# Patient Record
Sex: Female | Born: 1990 | Race: White | Hispanic: No | Marital: Married | Smoking: Never smoker
Health system: Southern US, Community
[De-identification: ages and names within clinical notes are randomized; demographics above are authoritative.]

## PROBLEM LIST (undated history)

## (undated) DIAGNOSIS — J45909 Unspecified asthma, uncomplicated: Secondary | ICD-10-CM

## (undated) HISTORY — DX: Unspecified asthma, uncomplicated: J45.909

## (undated) HISTORY — PX: WISDOM TOOTH EXTRACTION: SHX21

---

## 2015-03-15 ENCOUNTER — Emergency Department: Payer: BLUE CROSS/BLUE SHIELD

## 2015-03-15 ENCOUNTER — Emergency Department
Admission: EM | Admit: 2015-03-15 | Discharge: 2015-03-15 | Disposition: A | Discharge status: Home / Self Care | Payer: BLUE CROSS/BLUE SHIELD | Attending: Emergency Medicine | Admitting: Emergency Medicine

## 2015-03-15 ENCOUNTER — Encounter: Payer: Self-pay | Admitting: Emergency Medicine

## 2015-03-15 DIAGNOSIS — Z3A01 Less than 8 weeks gestation of pregnancy: Secondary | ICD-10-CM | POA: Diagnosis not present

## 2015-03-15 DIAGNOSIS — R110 Nausea: Secondary | ICD-10-CM | POA: Diagnosis not present

## 2015-03-15 DIAGNOSIS — R197 Diarrhea, unspecified: Secondary | ICD-10-CM | POA: Diagnosis not present

## 2015-03-15 DIAGNOSIS — R1032 Left lower quadrant pain: Secondary | ICD-10-CM | POA: Insufficient documentation

## 2015-03-15 DIAGNOSIS — Z3491 Encounter for supervision of normal pregnancy, unspecified, first trimester: Secondary | ICD-10-CM

## 2015-03-15 DIAGNOSIS — O9989 Other specified diseases and conditions complicating pregnancy, childbirth and the puerperium: Secondary | ICD-10-CM | POA: Insufficient documentation

## 2015-03-15 LAB — COMPREHENSIVE METABOLIC PANEL
ALT: 19 U/L (ref 14–54)
AST: 16 U/L (ref 15–41)
Albumin: 4.3 g/dL (ref 3.5–5.0)
Alkaline Phosphatase: 58 U/L (ref 38–126)
Anion gap: 7 (ref 5–15)
BUN: 12 mg/dL (ref 6–20)
CHLORIDE: 104 mmol/L (ref 101–111)
CO2: 24 mmol/L (ref 22–32)
CREATININE: 0.6 mg/dL (ref 0.44–1.00)
Calcium: 9.5 mg/dL (ref 8.9–10.3)
GFR calc non Af Amer: >60 mL/min (ref >60)
Glucose, Bld: 85 mg/dL (ref 65–99)
POTASSIUM: 3.8 mmol/L (ref 3.5–5.1)
SODIUM: 135 mmol/L (ref 135–145)
Total Bilirubin: 0.5 mg/dL (ref 0.3–1.2)
Total Protein: 8.3 g/dL — ABNORMAL HIGH (ref 6.5–8.1)

## 2015-03-15 LAB — URINALYSIS COMPLETE WITH MICROSCOPIC (ARMC ONLY)
BILIRUBIN URINE: NEGATIVE
Glucose, UA: NEGATIVE mg/dL
Ketones, ur: NEGATIVE mg/dL
LEUKOCYTES UA: NEGATIVE
Nitrite: NEGATIVE
PH: 6 (ref 5.0–8.0)
PROTEIN: NEGATIVE mg/dL
Specific Gravity, Urine: 1.02 (ref 1.005–1.030)

## 2015-03-15 LAB — CBC
HEMATOCRIT: 38.6 % (ref 35.0–47.0)
Hemoglobin: 12.9 g/dL (ref 12.0–16.0)
MCH: 29.2 pg (ref 26.0–34.0)
MCHC: 33.5 g/dL (ref 32.0–36.0)
MCV: 87 fL (ref 80.0–100.0)
PLATELETS: 233 10*3/uL (ref 150–440)
RBC: 4.44 MIL/uL (ref 3.80–5.20)
RDW: 13.8 % (ref 11.5–14.5)
WBC: 10.9 10*3/uL (ref 3.6–11.0)

## 2015-03-15 LAB — HCG, QUANTITATIVE, PREGNANCY: HCG, BETA CHAIN, QUANT, S: 45247 m[IU]/mL — AB (ref <5)

## 2015-03-15 LAB — LIPASE, BLOOD: LIPASE: 43 U/L (ref 11–51)

## 2015-03-15 LAB — POCT PREGNANCY, URINE: PREG TEST UR: POSITIVE — AB

## 2015-03-15 NOTE — ED Notes (Signed)
MD Lord at bedside. 

## 2015-03-15 NOTE — ED Notes (Addendum)
Pt presents to ED with c/o lower left quadrant abdominal cramping, diarrhea and nausea x3 weeks. Pt is [redacted] weeks pregnant and has not seen OBGYN yet, denies discharge of vaginal bleed. Pt alerts and oriented x4 at this time and signs of distress noted. 10 bowl movements within last 24hrs.

## 2015-03-15 NOTE — ED Notes (Signed)
Pt returned from U/S via stretcher. 

## 2015-03-15 NOTE — ED Provider Notes (Signed)
[facility] Emergency Department Provider Note   ____________________________________________  Time seen:  I have reviewed the triage vital signs and the triage nursing note.  HISTORY  Chief Complaint Abdominal Pain; Diarrhea; and Nausea   Historian Patient  HPI Chelsey Fernandez is a 24 y.o. female is here for evaluation of several weeks of nausea and diarrhea with some intermittent abdominal cramping which is typically in the left lower quadrant. Patient did find out recently that she had a positive pregnancy test. She has not had an ultrasound yet. She's not having any black or bloody stools. No vomiting. Seems to be diarrhea no matter what she eats. No specific exacerbating or alleviating factors. She did try giving up they referred one or 2 weeks and did not see any difference. No out of country travel. No sick contacts. No bad food.Symptoms are moderate.    History reviewed. No pertinent past medical history.  There are no active problems to display for this patient.   History reviewed. No pertinent past surgical history.  No current outpatient prescriptions on file.  Allergies Review of patient's allergies indicates no known allergies.  History reviewed. No pertinent family history. no family history of Crohn's disease or ulcerative colitis/irritable bowel disease.  Social History Social History  Substance Use Topics  . Smoking status: Never Smoker   . Smokeless tobacco: None  . Alcohol Use: No    Review of Systems  Constitutional: Negative for fever. Eyes: Negative for visual changes. ENT: Negative for sore throat. Cardiovascular: Negative for chest pain. Respiratory: Negative for shortness of breath. Gastrointestinal: Negative for previous problems with constipation and diarrhea. Genitourinary: Negative for dysuria. Musculoskeletal: Negative for back pain. Skin: Negative for rash. Neurological: Negative for headache. 10 point Review  of Systems otherwise negative ____________________________________________   PHYSICAL EXAM:  VITAL SIGNS: ED Triage Vitals  Enc Vitals Group     BP 03/15/15 1931 118/60 mmHg     Pulse Rate 03/15/15 1931 82     Resp 03/15/15 1931 18     Temp 03/15/15 1931 97.4 F (36.3 C)     Temp Source 03/15/15 1931 Oral     SpO2 03/15/15 1931 100 %     Weight 03/15/15 1931 204 lb (92.534 kg)     Height 03/15/15 1931  (1.702 m)     Head Cir --      Peak Flow --      Pain Score 03/15/15 1931 4     Pain Loc --      Pain Edu? --      Excl. in GC? --      Constitutional: Alert and oriented. Well appearing and in no distress. Eyes: Conjunctivae are normal. PERRL. Normal extraocular movements. ENT   Head: Normocephalic and atraumatic.   Nose: No congestion/rhinnorhea.   Mouth/Throat: Mucous membranes are moist.   Neck: No stridor. Cardiovascular/Chest: Normal rate, regular rhythm.  No murmurs, rubs, or gallops. Respiratory: Normal respiratory effort without tachypnea nor retractions. Breath sounds are clear and equal bilaterally. No wheezes/rales/rhonchi. Gastrointestinal: Soft. No distention, no guarding, no rebound. Nontender  Genitourinary/rectal:Deferred Musculoskeletal: Nontender with normal range of motion in all extremities. No joint effusions.  No lower extremity tenderness.  No edema. Neurologic:  Normal speech and language. No gross or focal neurologic deficits are appreciated. Skin:  Skin is warm, dry and intact. No rash noted. Psychiatric: Mood and affect are normal. Speech and behavior are normal. Patient exhibits appropriate insight and judgment.  ____________________________________________   EKG  I, Governor Rooksebecca Taelyr Jantz, MD, the attending physician have personally viewed and interpreted all ECGs.  None ____________________________________________  LABS (pertinent positives/negatives)  Lipase 43 Comprehensive metabolic panel without significant abnormality CBC  within normal limits Urinalysis rare bacteria and otherwise negative Pregnancy test positive Beta-hCG pending ____________________________________________  RADIOLOGY All Xrays were viewed by me. Imaging interpreted by Radiologist.  Ultrasound pelvic and transvaginal:  IMPRESSION: Single live intrauterine pregnancy noted, with a crown-rump length of 5 mm, corresponding to a gestational age of [redacted] weeks 1 day. This matches the gestational age by LMP, and reflects an estimated date of delivery of November 07, 2015. __________________________________________  PROCEDURES  Procedure(s) performed: None  Critical Care performed: None  ____________________________________________   ED COURSE / ASSESSMENT AND PLAN  CONSULTATIONS: None  Pertinent labs & imaging results that were available during my care of the patient were reviewed by me and considered in my medical decision making (see chart for details).   In terms of the diarrhea 3 weeks, her exam and evaluation are reassuring. I discussed with her to follow up with primary care physician and likely gastroenterologist. She may need to send a stool culture, and she can range his with primary care physician. We did discuss the possibility of trying an elimination diet.  Although she's not had any vaginal bleeding, and she doesn't currently have abdominal pain, she has had some intermittent recurrent left-sided left lower quadrant pain, and so I felt it necessary to image the early pregnancy to confirm intrauterine pregnancy. Single live IUP estimated be 6 weeks 1 day it was confirmed.   Patient / Family / Caregiver informed of clinical course, medical decision-making process, and agree with plan.   I discussed return precautions, follow-up instructions, and discharged instructions with patient and/or family.  ___________________________________________   FINAL CLINICAL IMPRESSION(S) / ED DIAGNOSES   Final diagnoses:  First  trimester pregnancy  Diarrhea, unspecified type       Governor Rooksebecca Geraldyn Shain, MD 03/15/15 2237

## 2015-03-15 NOTE — Discharge Instructions (Signed)
You were evaluated for ongoing diarrhea, and although no certain cause was found, your exam and evaluation are reassuring. Return to the emergency room for any worsening condition including abdominal pain, black or bloody diarrhea, or fever. We discussed you may try an elimination diet such as the "Whole 30"  Take only Tylenol and pregnancy.   Diarrhea Diarrhea is frequent loose and watery bowel movements. It can cause you to feel weak and dehydrated. Dehydration can cause you to become tired and thirsty, have a dry mouth, and have decreased urination that often is dark yellow. Diarrhea is a sign of another problem, most often an infection that will not last long. In most cases, diarrhea typically lasts 2-3 days. However, it can last longer if it is a sign of something more serious. It is important to treat your diarrhea as directed by your caregiver to lessen or prevent future episodes of diarrhea. CAUSES  Some common causes include:  Gastrointestinal infections caused by viruses, bacteria, or parasites.  Food poisoning or food allergies.  Certain medicines, such as antibiotics, chemotherapy, and laxatives.  Artificial sweeteners and fructose.  Digestive disorders. HOME CARE INSTRUCTIONS  Ensure adequate fluid intake (hydration): Have 1 cup (8 oz) of fluid for each diarrhea episode. Avoid fluids that contain simple sugars or sports drinks, fruit juices, whole milk products, and sodas. Your urine should be clear or pale yellow if you are drinking enough fluids. Hydrate with an oral rehydration solution that you can purchase at pharmacies, retail stores, and online. You can prepare an oral rehydration solution at home by mixing the following ingredients together:   - tsp table salt.   tsp baking soda.   tsp salt substitute containing potassium chloride.  1  tablespoons sugar.  1 L (34 oz) of water.  Certain foods and beverages may increase the speed at which food moves through the  gastrointestinal (GI) tract. These foods and beverages should be avoided and include:  Caffeinated and alcoholic beverages.  High-fiber foods, such as raw fruits and vegetables, nuts, seeds, and whole grain breads and cereals.  Foods and beverages sweetened with sugar alcohols, such as xylitol, sorbitol, and mannitol.  Some foods may be well tolerated and may help thicken stool including:  Starchy foods, such as rice, toast, pasta, low-sugar cereal, oatmeal, grits, baked potatoes, crackers, and bagels.  Bananas.  Applesauce.  Add probiotic-rich foods to help increase healthy bacteria in the GI tract, such as yogurt and fermented milk products.  Wash your hands well after each diarrhea episode.  Only take over-the-counter or prescription medicines as directed by your caregiver.  Take a warm bath to relieve any burning or pain from frequent diarrhea episodes. SEEK IMMEDIATE MEDICAL CARE IF:   You are unable to keep fluids down.  You have persistent vomiting.  You have blood in your stool, or your stools are black and tarry.  You do not urinate in 6-8 hours, or there is only a small amount of very dark urine.  You have abdominal pain that increases or localizes.  You have weakness, dizziness, confusion, or light-headedness.  You have a severe headache.  Your diarrhea gets worse or does not get better.  You have a fever or persistent symptoms for more than 2-3 days.  You have a fever and your symptoms suddenly get worse. MAKE SURE YOU:   Understand these instructions.  Will watch your condition.  Will get help right away if you are not doing well or get worse.   This  information is not intended to replace advice given to you by your health care provider. Make sure you discuss any questions you have with your health care provider.   Document Released: 03/16/2002 Document Revised: 04/16/2014 Document Reviewed: 12/02/2011 Elsevier Interactive Patient Education [id number]  [employer]vier Inc[employer]

## 2015-03-18 ENCOUNTER — Ambulatory Visit (INDEPENDENT_AMBULATORY_CARE_PROVIDER_SITE_OTHER): Payer: BLUE CROSS/BLUE SHIELD | Admitting: Physician Assistant

## 2015-03-18 ENCOUNTER — Encounter: Payer: Self-pay | Admitting: Physician Assistant

## 2015-03-18 VITALS — BP 112/60 | HR 79 | Temp 98.5°F | Resp 16 | Ht 67.0 in | Wt 203.8 lb

## 2015-03-18 DIAGNOSIS — Z3401 Encounter for supervision of normal first pregnancy, first trimester: Secondary | ICD-10-CM

## 2015-03-18 DIAGNOSIS — R197 Diarrhea, unspecified: Secondary | ICD-10-CM | POA: Diagnosis not present

## 2015-03-18 NOTE — Progress Notes (Signed)
Patient: Chelsey Fernandez Female    DOB: 1991/03/17   24 y.o.   MRN: [id number] Visit Date: 03/18/2015  Today's Provider: Margaretann Loveless, PA-C   Chief Complaint  Patient presents with  . Establish Care  . Diarrhea   Subjective:    HPI Establish Care: Patient is here as new patient to establish care. Patient is currently [redacted] weeks pregnant. Per patient had a wellness April of this year. Per patient pap came back normal. Patient previous doctors was [facility]. Patient is also concern about having diarrhea for 4 weeks. Went to [facility] walk in clinic and was told patient had colitis caused by pregnancy hormones. Since the diarrhea continued after that visit with diet changes, including adding a probiotic, patient went to the ED [facility] on Tuesday the 7th and was told it was most likely an abnormal growth of bacteria caused by pregnancy. She was told to continue the probiotic and add fiber. Per patient she has added probiotics and preprobiotic (Pedialyte with prebiotic) and eating a fiber-rich cereal and it seems to help. Per patient thinks is getting better. Had 4 bowel movement yesterday and they were normal. He has not had a bowel movement today. She states her concern now is that yesterday and today following eating she noticed being more bloated than normal but that it subsides within 30 minutes to an hour after eating. She would like to follow through with stool cultures as recommended by the ER physician to make sure there is no other cause for her symptoms. She denies any fevers, chills, nausea, vomiting, melena or hematochezia. There is no family history of inflammatory bowel disease including Crohn's or ulcerative colitis. There is no family history of colon cancer.     No Known Allergies Previous Medications   PRENATAL VIT-FE FUMARATE-FA (PRENATAL VITAMIN PO)    Take by mouth daily.    Review of Systems    Constitutional: Negative.   HENT: Negative.   Eyes: Negative.   Respiratory: Negative.   Cardiovascular: Negative.   Gastrointestinal: Positive for abdominal pain, diarrhea and abdominal distention. Negative for nausea, vomiting and blood in stool.  Endocrine: Negative.   Genitourinary: Negative.   Musculoskeletal: Negative.   Allergic/Immunologic: Negative.   Neurological: Negative.   Hematological: Negative.   Psychiatric/Behavioral: Negative.     Social History  Substance Use Topics  . Smoking status: Never Smoker   . Smokeless tobacco: Never Used  . Alcohol Use: No   Objective:   BP 112/60 mmHg  Pulse 79  Temp(Src) 98.5 F (36.9 C) (Oral)  Resp 16  Ht  (1.702 m)  Wt 203 lb 12.8 oz (92.443 kg)  BMI 31.91 kg/m2  LMP 01/31/2015 (Approximate)  Physical Exam  Constitutional: She is oriented to person, place, and time. She appears well-developed and well-nourished. No distress.  HENT:  Head: Normocephalic and atraumatic.  Right Ear: External ear normal.  Left Ear: External ear normal.  Nose: Nose normal.  Mouth/Throat: Oropharynx is clear and moist. No oropharyngeal exudate.  Eyes: Conjunctivae and EOM are normal. Pupils are equal, round, and reactive to light. Right eye exhibits no discharge. Left eye exhibits no discharge. No scleral icterus.  Neck: Normal range of motion. Neck supple. No JVD present. No tracheal deviation present. No thyromegaly present.  Cardiovascular: Normal rate, regular rhythm, normal heart sounds and intact distal pulses.  Exam reveals no gallop and no friction rub.   No murmur heard.  Pulmonary/Chest: Effort normal and breath sounds normal. No respiratory distress. She has no wheezes. She has no rales. She exhibits no tenderness.  Abdominal: Soft. Bowel sounds are normal. She exhibits no distension and no mass. There is no hepatosplenomegaly. There is generalized tenderness. There is no rigidity, no rebound, no guarding, no CVA tenderness,  no tenderness at McBurney's point and negative Murphy's sign.  Musculoskeletal: Normal range of motion. She exhibits no edema or tenderness.  Lymphadenopathy:    She has no cervical adenopathy.  Neurological: She is alert and oriented to person, place, and time.  Skin: Skin is warm and dry. No rash noted. She is not diaphoretic.  Psychiatric: She has a normal mood and affect. Her behavior is normal. Judgment and thought content normal.  Vitals reviewed.       Assessment & Plan:     1. Diarrhea, unspecified type Unknown cause. It is felt that since she is having improvement with prebiotics, probiotics and increased fiber that she may have had a bacterial overgrowth in the gut secondary to stress on the body from the pregnancy and increasing hormones. We will go ahead and obtain a stool sample for labs as below. She has had blood work done on December 7 which was all normal. I will follow-up with her pending the stool culture labs. If everything is normal and she continues to improve with diet changes I will follow-up with her in 4 weeks to make sure that she is continuing to do well. She may cancel this appointment if she continues to improve and feels it is not necessary. If anything comes back abnormal or she begins to develop diarrhea again we will you with referral to gastroenterology for further evaluation. In the meantime she is to continue her current diet changes. I did advise her to please call the office if symptoms fail to improve or worsen in the meantime. - Stool Culture - Stool, WBC/Lactoferrin - Stool C-Diff Toxin Assay - Ova and parasite examination  2. Pregnancy, first, first trimester Is [facility]. She has her first appointment in approximately 4 weeks there as well when she is [redacted] weeks pregnant. She is currently 6 weeks and 3 days.       Margaretann LovelessJennifer M Mee Macdonnell, PA-C  [facility]

## 2015-03-18 NOTE — Patient Instructions (Signed)
Diarrhea Diarrhea is frequent loose and watery bowel movements. It can cause you to feel weak and dehydrated. Dehydration can cause you to become tired and thirsty, have a dry mouth, and have decreased urination that often is dark yellow. Diarrhea is a sign of another problem, most often an infection that will not last long. In most cases, diarrhea typically lasts 2-3 days. However, it can last longer if it is a sign of something more serious. It is important to treat your diarrhea as directed by your caregiver to lessen or prevent future episodes of diarrhea. CAUSES  Some common causes include:  Gastrointestinal infections caused by viruses, bacteria, or parasites.  Food poisoning or food allergies.  Certain medicines, such as antibiotics, chemotherapy, and laxatives.  Artificial sweeteners and fructose.  Digestive disorders. HOME CARE INSTRUCTIONS  Ensure adequate fluid intake (hydration): Have 1 cup (8 oz) of fluid for each diarrhea episode. Avoid fluids that contain simple sugars or sports drinks, fruit juices, whole milk products, and sodas. Your urine should be clear or pale yellow if you are drinking enough fluids. Hydrate with an oral rehydration solution that you can purchase at pharmacies, retail stores, and online. You can prepare an oral rehydration solution at home by mixing the following ingredients together:   - tsp table salt.   tsp baking soda.   tsp salt substitute containing potassium chloride.  1  tablespoons sugar.  1 L (34 oz) of water.  Certain foods and beverages may increase the speed at which food moves through the gastrointestinal (GI) tract. These foods and beverages should be avoided and include:  Caffeinated and alcoholic beverages.  High-fiber foods, such as raw fruits and vegetables, nuts, seeds, and whole grain breads and cereals.  Foods and beverages sweetened with sugar alcohols, such as xylitol, sorbitol, and mannitol.  Some foods may be well  tolerated and may help thicken stool including:  Starchy foods, such as rice, toast, pasta, low-sugar cereal, oatmeal, grits, baked potatoes, crackers, and bagels.  Bananas.  Applesauce.  Add probiotic-rich foods to help increase healthy bacteria in the GI tract, such as yogurt and fermented milk products.  Wash your hands well after each diarrhea episode.  Only take over-the-counter or prescription medicines as directed by your caregiver.  Take a warm bath to relieve any burning or pain from frequent diarrhea episodes. SEEK IMMEDIATE MEDICAL CARE IF:   You are unable to keep fluids down.  You have persistent vomiting.  You have blood in your stool, or your stools are black and tarry.  You do not urinate in 6-8 hours, or there is only a small amount of very dark urine.  You have abdominal pain that increases or localizes.  You have weakness, dizziness, confusion, or light-headedness.  You have a severe headache.  Your diarrhea gets worse or does not get better.  You have a fever or persistent symptoms for more than 2-3 days.  You have a fever and your symptoms suddenly get worse. MAKE SURE YOU:   Understand these instructions.  Will watch your condition.  Will get help right away if you are not doing well or get worse.   This information is not intended to replace advice given to you by your health care provider. Make sure you discuss any questions you have with your health care provider.   Document Released: 03/16/2002 Document Revised: 04/16/2014 Document Reviewed: 12/02/2011 Elsevier Interactive Patient Education 2016 Elsevier Inc[employer]   Food Choices to Help Relieve Diarrhea, Adult When you have   diarrhea, the foods you eat and your eating habits are very important. Choosing the right foods and drinks can help relieve diarrhea. Also, because diarrhea can last up to 7 days, you need to replace lost fluids and electrolytes (such as sodium, potassium, and chloride) in  order to help prevent dehydration.  WHAT GENERAL GUIDELINES DO I NEED TO FOLLOW?  Slowly drink 1 cup (8 oz) of fluid for each episode of diarrhea. If you are getting enough fluid, your urine will be clear or pale yellow.  Eat starchy foods. Some good choices include white rice, white toast, pasta, low-fiber cereal, baked potatoes (without the skin), saltine crackers, and bagels.  Avoid large servings of any cooked vegetables.  Limit fruit to two servings per day. A serving is  cup or 1 small piece.  Choose foods with less than 2 g of fiber per serving.  Limit fats to less than 8 tsp (38 g) per day.  Avoid fried foods.  Eat foods that have probiotics in them. Probiotics can be found in certain dairy products.  Avoid foods and beverages that may increase the speed at which food moves through the stomach and intestines (gastrointestinal tract). Things to avoid include:  High-fiber foods, such as dried fruit, raw fruits and vegetables, nuts, seeds, and whole grain foods.  Spicy foods and high-fat foods.  Foods and beverages sweetened with high-fructose corn syrup, honey, or sugar alcohols such as xylitol, sorbitol, and mannitol. WHAT FOODS ARE RECOMMENDED? Grains White rice. White, French, or pita breads (fresh or toasted), including plain rolls, buns, or bagels. White pasta. Saltine, soda, or graham crackers. Pretzels. Low-fiber cereal. Cooked cereals made with water (such as cornmeal, farina, or cream cereals). Plain muffins. Matzo. Melba toast. Zwieback.  Vegetables Potatoes (without the skin). Strained tomato and vegetable juices. Most well-cooked and canned vegetables without seeds. Tender lettuce. Fruits Cooked or canned applesauce, apricots, cherries, fruit cocktail, grapefruit, peaches, pears, or plums. Fresh bananas, apples without skin, cherries, grapes, cantaloupe, grapefruit, peaches, oranges, or plums.  Meat and Other Protein Products Baked or boiled chicken. Eggs. Tofu.  Fish. Seafood. Smooth peanut butter. Ground or well-cooked tender beef, ham, veal, lamb, pork, or poultry.  Dairy Plain yogurt, kefir, and unsweetened liquid yogurt. Lactose-free milk, buttermilk, or soy milk. Plain hard cheese. Beverages Sport drinks. Clear broths. Diluted fruit juices (except prune). Regular, caffeine-free sodas such as ginger ale. Water. Decaffeinated teas. Oral rehydration solutions. Sugar-free beverages not sweetened with sugar alcohols. Other Bouillon, broth, or soups made from recommended foods.  The items listed above may not be a complete list of recommended foods or beverages. Contact your dietitian for more options. WHAT FOODS ARE NOT RECOMMENDED? Grains Whole grain, whole wheat, bran, or rye breads, rolls, pastas, crackers, and cereals. Wild or brown rice. Cereals that contain more than 2 g of fiber per serving. Corn tortillas or taco shells. Cooked or dry oatmeal. Granola. Popcorn. Vegetables Raw vegetables. Cabbage, broccoli, Brussels sprouts, artichokes, baked beans, beet greens, corn, kale, legumes, peas, sweet potatoes, and yams. Potato skins. Cooked spinach and cabbage. Fruits Dried fruit, including raisins and dates. Raw fruits. Stewed or dried prunes. Fresh apples with skin, apricots, mangoes, pears, raspberries, and strawberries.  Meat and Other Protein Products Chunky peanut butter. Nuts and seeds. Beans and lentils. Bacon.  Dairy High-fat cheeses. Milk, chocolate milk, and beverages made with milk, such as milk shakes. Cream. Ice cream. Sweets and Desserts Sweet rolls, doughnuts, and sweet breads. Pancakes and waffles. Fats and Oils Butter. Cream sauces.   Margarine. Salad oils. Plain salad dressings. Olives. Avocados.  Beverages Caffeinated beverages (such as coffee, tea, soda, or energy drinks). Alcoholic beverages. Fruit juices with pulp. Prune juice. Soft drinks sweetened with high-fructose corn syrup or sugar alcohols. Other Coconut. Hot sauce.  Chili powder. Mayonnaise. Gravy. Cream-based or milk-based soups.  The items listed above may not be a complete list of foods and beverages to avoid. Contact your dietitian for more information. WHAT SHOULD I DO IF I BECOME DEHYDRATED? Diarrhea can sometimes lead to dehydration. Signs of dehydration include dark urine and dry mouth and skin. If you think you are dehydrated, you should rehydrate with an oral rehydration solution. These solutions can be purchased at pharmacies, retail stores, or online.  Drink -1 cup (120-240 mL) of oral rehydration solution each time you have an episode of diarrhea. If drinking this amount makes your diarrhea worse, try drinking smaller amounts more often. For example, drink 1-3 tsp (5-15 mL) every 5-10 minutes.  A general rule for staying hydrated is to drink 1-2 L of fluid per day. Talk to your health care provider about the specific amount you should be drinking each day. Drink enough fluids to keep your urine clear or pale yellow.   This information is not intended to replace advice given to you by your health care provider. Make sure you discuss any questions you have with your health care provider.   Document Released: 06/16/2003 Document Revised: 04/16/2014 Document Reviewed: 02/16/2013 Elsevier Interactive Patient Education 2016 Elsevier Inc.   

## 2015-04-10 NOTE — L&D Delivery Note (Addendum)
Delivery Note At 1:16 AM on 11/11/15 a viable female was delivered via Vaginal, Spontaneous Delivery (Presentation: ROA;  ).delayed cord clamping   APGAR: 8, 9; weight  .   Placenta status:intact delivered 10 minutes post SVD , .  Cord:  with the following complications: .   Anesthesia:   Episiotomy:  none Lacerations: 2nd degree Suture Repair: 2.0 3.0 vicryl Est. Blood Loss (mL):  200cc  Mom to postpartum.  Baby to Couplet care / Skin to Skin.  Chelsey Fernandez 11/11/2015, 1:41 AM

## 2015-04-15 ENCOUNTER — Ambulatory Visit: Payer: BLUE CROSS/BLUE SHIELD | Admitting: Physician Assistant

## 2015-04-21 LAB — OB RESULTS CONSOLE HEPATITIS B SURFACE ANTIGEN: HEP B S AG: NEGATIVE

## 2015-04-21 LAB — OB RESULTS CONSOLE RPR: RPR: NONREACTIVE

## 2015-04-21 LAB — OB RESULTS CONSOLE HIV ANTIBODY (ROUTINE TESTING): HIV: NONREACTIVE

## 2015-04-21 LAB — OB RESULTS CONSOLE RUBELLA ANTIBODY, IGM: RUBELLA: IMMUNE

## 2015-04-21 LAB — OB RESULTS CONSOLE VARICELLA ZOSTER ANTIBODY, IGG: Varicella: IMMUNE

## 2015-04-21 LAB — OB RESULTS CONSOLE GC/CHLAMYDIA
CHLAMYDIA, DNA PROBE: NEGATIVE
Gonorrhea: NEGATIVE

## 2015-11-10 ENCOUNTER — Inpatient Hospital Stay
Admission: EM | Admit: 2015-11-10 | Discharge: 2015-11-13 | DRG: 775 | Disposition: A | Discharge status: Home / Self Care | Payer: BLUE CROSS/BLUE SHIELD | Attending: Obstetrics and Gynecology | Admitting: Obstetrics and Gynecology

## 2015-11-10 DIAGNOSIS — O99824 Streptococcus B carrier state complicating childbirth: Secondary | ICD-10-CM | POA: Diagnosis present

## 2015-11-10 DIAGNOSIS — Z3A40 40 weeks gestation of pregnancy: Secondary | ICD-10-CM | POA: Diagnosis not present

## 2015-11-10 DIAGNOSIS — R079 Chest pain, unspecified: Secondary | ICD-10-CM | POA: Diagnosis not present

## 2015-11-10 LAB — CBC
HCT: 34.9 % — ABNORMAL LOW (ref 35.0–47.0)
HEMOGLOBIN: 12.5 g/dL (ref 12.0–16.0)
MCH: 30.7 pg (ref 26.0–34.0)
MCHC: 35.9 g/dL (ref 32.0–36.0)
MCV: 85.5 fL (ref 80.0–100.0)
PLATELETS: 206 10*3/uL (ref 150–440)
RBC: 4.09 MIL/uL (ref 3.80–5.20)
RDW: 14.7 % — AB (ref 11.5–14.5)
WBC: 11 10*3/uL (ref 3.6–11.0)

## 2015-11-10 LAB — CHLAMYDIA/NGC RT PCR (ARMC ONLY)
CHLAMYDIA TR: NOT DETECTED
N gonorrhoeae: NOT DETECTED

## 2015-11-10 LAB — TYPE AND SCREEN
ABO/RH(D): O POS
ANTIBODY SCREEN: NEGATIVE

## 2015-11-10 MED ORDER — LACTATED RINGERS IV SOLN: 500.0000 mL | INTRAVENOUS | Status: DC | PRN

## 2015-11-10 MED ORDER — SODIUM CHLORIDE FLUSH 0.9 % IV SOLN
INTRAVENOUS | Status: AC
  Filled 2015-11-10: qty 10

## 2015-11-10 MED ORDER — OXYTOCIN 40 UNITS IN LACTATED RINGERS INFUSION - SIMPLE MED
1.0000 m[IU]/min | INTRAVENOUS | Status: DC
  Administered 2015-11-10: 2 m[IU]/min via INTRAVENOUS

## 2015-11-10 MED ORDER — OXYTOCIN BOLUS FROM INFUSION
500.0000 mL | Freq: Once | INTRAVENOUS | Status: AC
  Administered 2015-11-11: 500 mL via INTRAVENOUS

## 2015-11-10 MED ORDER — SOD CITRATE-CITRIC ACID 500-334 MG/5ML PO SOLN: 30.0000 mL | ORAL | Status: DC | PRN

## 2015-11-10 MED ORDER — ACETAMINOPHEN 325 MG PO TABS: 650.0000 mg | ORAL_TABLET | ORAL | Status: DC | PRN

## 2015-11-10 MED ORDER — AMMONIA AROMATIC IN INHA
RESPIRATORY_TRACT | Status: AC
  Filled 2015-11-10: qty 10

## 2015-11-10 MED ORDER — FLEET ENEMA 7-19 GM/118ML RE ENEM: 1.0000 | ENEMA | RECTAL | Status: DC | PRN

## 2015-11-10 MED ORDER — BUTORPHANOL TARTRATE 1 MG/ML IJ SOLN
1.0000 mg | INTRAMUSCULAR | Status: DC | PRN
  Administered 2015-11-10 (×2): 1 mg via INTRAVENOUS
  Filled 2015-11-10 (×2): qty 1

## 2015-11-10 MED ORDER — ONDANSETRON HCL 4 MG/2ML IJ SOLN: 4.0000 mg | Freq: Four times a day (QID) | INTRAMUSCULAR | Status: DC | PRN

## 2015-11-10 MED ORDER — LACTATED RINGERS IV SOLN
INTRAVENOUS | Status: DC
  Administered 2015-11-10: 14:00:00 via INTRAVENOUS

## 2015-11-10 MED ORDER — OXYTOCIN 40 UNITS IN LACTATED RINGERS INFUSION - SIMPLE MED
INTRAVENOUS | Status: AC
  Administered 2015-11-10: 2 m[IU]/min via INTRAVENOUS
  Filled 2015-11-10: qty 1000

## 2015-11-10 MED ORDER — MISOPROSTOL 200 MCG PO TABS
ORAL_TABLET | ORAL | Status: AC
  Filled 2015-11-10: qty 4

## 2015-11-10 MED ORDER — SODIUM CHLORIDE 0.9 % IV SOLN
1.0000 g | INTRAVENOUS | Status: DC
  Administered 2015-11-10 (×2): 1 g via INTRAVENOUS
  Filled 2015-11-10 (×4): qty 1000

## 2015-11-10 MED ORDER — LIDOCAINE HCL (PF) 1 % IJ SOLN
INTRAMUSCULAR | Status: AC
  Administered 2015-11-11: 30 mL via SUBCUTANEOUS
  Filled 2015-11-10: qty 30

## 2015-11-10 MED ORDER — OXYTOCIN 40 UNITS IN LACTATED RINGERS INFUSION - SIMPLE MED
2.5000 [IU]/h | INTRAVENOUS | Status: DC
  Administered 2015-11-11: 2.5 [IU]/h via INTRAVENOUS

## 2015-11-10 MED ORDER — SODIUM CHLORIDE 0.9 % IV SOLN
2.0000 g | Freq: Once | INTRAVENOUS | Status: AC
  Administered 2015-11-10: 2 g via INTRAVENOUS
  Filled 2015-11-10: qty 2000

## 2015-11-10 MED ORDER — LIDOCAINE HCL (PF) 1 % IJ SOLN
30.0000 mL | INTRAMUSCULAR | Status: DC | PRN
  Administered 2015-11-11: 30 mL via SUBCUTANEOUS

## 2015-11-10 MED ORDER — OXYTOCIN 10 UNIT/ML IJ SOLN
INTRAMUSCULAR | Status: AC
  Filled 2015-11-10: qty 2

## 2015-11-10 NOTE — Progress Notes (Signed)
Patient ID: Chelsey Fernandez, female   DOB: 01/31/1991, 25 y.o.   MRN: [id number] arom Clear 5 cm /90 /0 station . Reassuring fetal monitor .

## 2015-11-10 NOTE — Progress Notes (Signed)
Patient ID: Chelsey Fernandez, female   DOB: 10-Jun-1990, 25 y.o.   MRN: [id number] 40+3 week . Active labor . On Ampicillin for + GBS status  Reassuring fetal monitoring  Will rupture later . All questions answered

## 2015-11-10 NOTE — H&P (Signed)
OB ADMISSION/ HISTORY & PHYSICAL:  Admission Date: 11/10/2015 12:35 PM  Admit Diagnosis: 40+3 weeks advanced cervical dilation and GBS Positive   Chelsey Fernandez is a 25 y.o. female G1P0, at 40+3 weeks, presenting for admission for postterm, advanced cervical dilation, and GBS positive.  She was seen in the office yesterday and was 3-4cm.  She states her contractions picked up this morning to every 5 minutes, and on cervical exam progressed to 4-5cm.  She desires a natural labor.   Prenatal History: G1P0   EDC: 11/07/15, by LMP of 01/31/15 Prenatal care at [facility]  Prenatal course complicated by vaginal bleeding at 28 weeks r/t friable cervix that resolved, GBS positive, Obesity.   Prenatal Labs: ABO, Rh:  O Positive Antibody:  Negative Rubella:   Immune Varicella: Immune RPR:   Non-Reactive HBsAg:   Negative HIV:   Negative GTT: 76 GBS:   POSITIVE   Medical / Surgical History :  Past medical history:  Past Medical History:  Diagnosis Date  . Asthma    Exercise induce     Past surgical history:  Past Surgical History:  Procedure Laterality Date  . WISDOM TOOTH EXTRACTION      Family History: No family history on file.   Social History:  reports that she has never smoked. She has never used smokeless tobacco. She reports that she does not drink alcohol or use drugs.   Allergies: Review of patient's allergies indicates no known allergies.    Current Medications at time of admission:  Prior to Admission medications   Medication Sig Start Date End Date Taking? Authorizing Provider  Prenatal Vit-Fe Fumarate-FA (PRENATAL VITAMIN PO) Take by mouth daily.    Historical Provider, MD     Review of Systems: Active FM onset of ctx @ 0700 currently every 5 minutes No LOF  / SROM @ No bloody show   Physical Exam:  VS: There were no vitals taken for this visit.  General: alert and oriented, appears calm Heart: RRR Lungs: Clear lung fields Abdomen: Gravid, soft  and non-tender, non-distended / uterus: gravid, non-tender  Extremities: +1 BLE edema  Genitalia / VE: 4.5cm/80%/-1/vtx  FHR: baseline rate 150 bpm / variability moderate / accelerations + / no decelerations TOCO: every 4 minutes   Assessment: 40+[redacted] weeks gestation Latent stage of labor FHR category 1 GBS Positive   Plan:  1. Admit to Birth Place     - Routine Labor and Delivery Orders    - May have Stadol 1mg  every 1 hour PRN for pain    - May have epidural upon request  2. GBS Positive    - Ampicillin 2 grams IV now, then 1gram every 4 hours 3. Contraception    - Condoms 4. Anticipate NSVD  Dr. Feliberto Gottron notified of admission / plan of care  Carlean Jews, CNM

## 2015-11-10 NOTE — Progress Notes (Signed)
Rui Sikkema is a 25 y.o. G1P0 at [redacted]w[redacted]d Subjective: Painful ctx  Objective: BP 118/67   Pulse 67   Temp 98.3 F (36.8 C) (Oral)   Resp 18   Ht 5\' 7"  (1.702 m)   Wt 102.5 kg (226 lb)   LMP 01/31/2015 (Approximate)   BMI 35.40 kg/m  I/O last 3 completed shifts: In: 147.9 [I.V.:97.9; IV Piggyback:50] Out: -  No intake/output data recorded.  FHT:  FHR: 120 bpm, variability: minimal ,  accelerations:  Present,  decelerations:  Absent UC:   regular, every 3-5 minutes SVE:   Dilation: 6 Effacement (%): 100 Station: +1 Exam by:: TJS, MD  Labs: Lab Results  Component Value Date   WBC 11.0 11/10/2015   HGB 12.5 11/10/2015   HCT 34.9 (L) 11/10/2015   MCV 85.5 11/10/2015   PLT 206 11/10/2015    Assessment / Plan: Active labor , reassuring fetal monitoring  Pt elects for minimal intervention by [place] .  Cont abx  Continue intermittent monitoring  Santoria Chason 11/10/2015, 8:30 PM

## 2015-11-11 ENCOUNTER — Encounter: Payer: Self-pay | Admitting: *Deleted

## 2015-11-11 LAB — CBC
HEMATOCRIT: 34.4 % — AB (ref 35.0–47.0)
Hemoglobin: 11.9 g/dL — ABNORMAL LOW (ref 12.0–16.0)
MCH: 29.8 pg (ref 26.0–34.0)
MCHC: 34.5 g/dL (ref 32.0–36.0)
MCV: 86.3 fL (ref 80.0–100.0)
Platelets: 208 10*3/uL (ref 150–440)
RBC: 3.99 MIL/uL (ref 3.80–5.20)
RDW: 14.9 % — AB (ref 11.5–14.5)
WBC: 19.3 10*3/uL — ABNORMAL HIGH (ref 3.6–11.0)

## 2015-11-11 LAB — RPR: RPR Ser Ql: NONREACTIVE

## 2015-11-11 MED ORDER — BENZOCAINE-MENTHOL 20-0.5 % EX AERO
1.0000 "application " | INHALATION_SPRAY | CUTANEOUS | Status: DC | PRN
  Administered 2015-11-11: 1 via TOPICAL
  Filled 2015-11-11: qty 56

## 2015-11-11 MED ORDER — COCONUT OIL OIL
1.0000 "application " | TOPICAL_OIL | Status: DC | PRN
  Filled 2015-11-11: qty 120

## 2015-11-11 MED ORDER — ZOLPIDEM TARTRATE 5 MG PO TABS: 5.0000 mg | ORAL_TABLET | Freq: Every evening | ORAL | Status: DC | PRN

## 2015-11-11 MED ORDER — FERROUS SULFATE 325 (65 FE) MG PO TABS
325.0000 mg | ORAL_TABLET | Freq: Two times a day (BID) | ORAL | Status: DC
  Administered 2015-11-11 – 2015-11-13 (×5): 325 mg via ORAL
  Filled 2015-11-11 (×6): qty 1

## 2015-11-11 MED ORDER — SIMETHICONE 80 MG PO CHEW: 80.0000 mg | CHEWABLE_TABLET | ORAL | Status: DC | PRN

## 2015-11-11 MED ORDER — MAGNESIUM HYDROXIDE 400 MG/5ML PO SUSP: 30.0000 mL | ORAL | Status: DC | PRN

## 2015-11-11 MED ORDER — ACETAMINOPHEN 325 MG PO TABS: 650.0000 mg | ORAL_TABLET | ORAL | Status: DC | PRN

## 2015-11-11 MED ORDER — DIBUCAINE 1 % RE OINT: 1.0000 "application " | TOPICAL_OINTMENT | RECTAL | Status: DC | PRN

## 2015-11-11 MED ORDER — AMMONIA AROMATIC IN INHA
RESPIRATORY_TRACT | Status: AC
  Filled 2015-11-11: qty 10

## 2015-11-11 MED ORDER — PRENATAL MULTIVITAMIN CH
1.0000 | ORAL_TABLET | Freq: Every day | ORAL | Status: DC
  Administered 2015-11-11 – 2015-11-13 (×3): 1 via ORAL
  Filled 2015-11-11 (×3): qty 1

## 2015-11-11 MED ORDER — DIPHENHYDRAMINE HCL 25 MG PO CAPS: 25.0000 mg | ORAL_CAPSULE | Freq: Four times a day (QID) | ORAL | Status: DC | PRN

## 2015-11-11 MED ORDER — ONDANSETRON HCL 4 MG/2ML IJ SOLN: 4.0000 mg | INTRAMUSCULAR | Status: DC | PRN

## 2015-11-11 MED ORDER — SENNOSIDES-DOCUSATE SODIUM 8.6-50 MG PO TABS
2.0000 | ORAL_TABLET | ORAL | Status: DC
  Administered 2015-11-13: 2 via ORAL
  Filled 2015-11-11 (×3): qty 2

## 2015-11-11 MED ORDER — OXYCODONE HCL 5 MG PO TABS: 10.0000 mg | ORAL_TABLET | ORAL | Status: DC | PRN

## 2015-11-11 MED ORDER — MEASLES, MUMPS & RUBELLA VAC ~~LOC~~ INJ: 0.5000 mL | INJECTION | Freq: Once | SUBCUTANEOUS | Status: DC

## 2015-11-11 MED ORDER — OXYCODONE HCL 5 MG PO TABS
5.0000 mg | ORAL_TABLET | ORAL | Status: DC | PRN
Start: 2015-11-11 — End: 2015-11-13

## 2015-11-11 MED ORDER — ONDANSETRON HCL 4 MG PO TABS: 4.0000 mg | ORAL_TABLET | ORAL | Status: DC | PRN

## 2015-11-11 MED ORDER — IBUPROFEN 600 MG PO TABS
600.0000 mg | ORAL_TABLET | Freq: Four times a day (QID) | ORAL | Status: DC
  Administered 2015-11-11 – 2015-11-13 (×5): 600 mg via ORAL
  Filled 2015-11-11 (×7): qty 1

## 2015-11-11 MED ORDER — WITCH HAZEL-GLYCERIN EX PADS: 1.0000 "application " | MEDICATED_PAD | CUTANEOUS | Status: DC | PRN

## 2015-11-11 NOTE — Progress Notes (Signed)
Post Partum Day dod Subjective: sore  Objective: Blood pressure (!) 110/55, pulse 70, temperature 98 F (36.7 C), temperature source Oral, resp. rate 18, height 5\' 7"  (1.702 m), weight 226 lb (102.5 kg), last menstrual period 01/31/2015, SpO2 99 %, unknown if currently breastfeeding.  Physical Exam:  General: alert and cooperative Lochia: appropriate Uterine Fundus: firm DVT Evaluation: No evidence of DVT seen on physical exam.   Recent Labs  11/10/15 1405 11/11/15 0654  HGB 12.5 11.9*  HCT 34.9* 34.4*    Assessment/Plan: Plan for discharge tomorrow   LOS: 1 day   Chelsey Fernandez 11/11/2015, 10:59 AM

## 2015-11-12 DIAGNOSIS — R079 Chest pain, unspecified: Secondary | ICD-10-CM

## 2015-11-12 NOTE — Progress Notes (Signed)
Post Partum Day 1 Subjective: up ad lib, voiding, tolerating PO and chest heaviness intermittently without SOB looks clinically well  Objective: Blood pressure 126/73, pulse 70, temperature 98.3 F (36.8 C), temperature source Oral, resp. rate 18, height 5\' 7"  (1.702 m), weight 226 lb (102.5 kg), last menstrual period 01/31/2015, SpO2 99 %, unknown if currently breastfeeding.  Physical Exam:  General: alert, cooperative and no distress Lochia: appropriate Uterine Fundus: firm, mildly tender but appropriately so Incision: healing well, no significant drainage, no dehiscence, no significant erythema DVT Evaluation: No evidence of DVT seen on physical exam. bilaterally symmetrical, no erythema or warmth, no tenderness   Recent Labs  11/10/15 1405 11/11/15 0654  HGB 12.5 11.9*  HCT 34.9* 34.4*    Assessment/Plan: Chest tightness: o2 sats 99-100%, no tachycardia. Clinical suspicion for PE low, but will get EKG and if worsening sx or O2 sat drop will get spiral CT Leukocytosis: WBC 19 today. No evidence infection at perineum, afebrile. Fundal tenderness appropriate for postpartum with fundal massage, but if worsens consider endometritis.  Repeat CBC in am  Breastfeeding    LOS: 2 days   Chelsey Fernandez 11/12/2015, 10:35 AM

## 2015-11-13 LAB — CBC WITH DIFFERENTIAL/PLATELET
Basophils Absolute: 0 10*3/uL (ref 0–0.1)
Basophils Relative: 0 %
EOS ABS: 0.2 10*3/uL (ref 0–0.7)
Eosinophils Relative: 3 %
HEMATOCRIT: 30.2 % — AB (ref 35.0–47.0)
HEMOGLOBIN: 10.4 g/dL — AB (ref 12.0–16.0)
LYMPHS ABS: 2.1 10*3/uL (ref 1.0–3.6)
Lymphocytes Relative: 22 %
MCH: 30.3 pg (ref 26.0–34.0)
MCHC: 34.6 g/dL (ref 32.0–36.0)
MCV: 87.5 fL (ref 80.0–100.0)
MONOS PCT: 8 %
Monocytes Absolute: 0.8 10*3/uL (ref 0.2–0.9)
NEUTROS PCT: 67 %
Neutro Abs: 6.2 10*3/uL (ref 1.4–6.5)
Platelets: 176 10*3/uL (ref 150–440)
RBC: 3.45 MIL/uL — ABNORMAL LOW (ref 3.80–5.20)
RDW: 14.8 % — ABNORMAL HIGH (ref 11.5–14.5)
WBC: 9.4 10*3/uL (ref 3.6–11.0)

## 2015-11-13 MED ORDER — FERROUS SULFATE 325 (65 FE) MG PO TABS: 325.0000 mg | ORAL_TABLET | Freq: Two times a day (BID) | ORAL | 3 refills | Status: DC

## 2015-11-13 MED ORDER — ASCORBIC ACID 250 MG PO TABS: 250.0000 mg | ORAL_TABLET | Freq: Two times a day (BID) | ORAL | 3 refills | Status: AC

## 2015-11-13 MED ORDER — DOCUSATE SODIUM 100 MG PO CAPS: 100.0000 mg | ORAL_CAPSULE | Freq: Every day | ORAL | 3 refills | Status: DC | PRN

## 2015-11-13 MED ORDER — IBUPROFEN 800 MG PO TABS: 800.0000 mg | ORAL_TABLET | Freq: Three times a day (TID) | ORAL | 2 refills | Status: DC | PRN

## 2015-11-13 NOTE — Progress Notes (Signed)
Education provided on need for TDaP vaccine.  Pt declines TDaP vaccine at this time. Reynold BowenSusan Paisley Giovanna Kemmerer, RN .11/13/2015 3:14 PM

## 2015-11-13 NOTE — Discharge Summary (Signed)
Obstetric Discharge Summary Reason for Admission: onset of labor Prenatal Procedures: ultrasound Intrapartum Procedures: spontaneous vaginal delivery Postpartum Procedures: none Complications-Operative and Postpartum: 2 degree perineal laceration   WBC elevated PPD#1 but resolved this morning. Afebrile throughout. Some chest tightness yesterday has resolved today. EKG wnl yesterday.   Hemoglobin  Date Value Ref Range Status  11/13/2015 10.4 (L) 12.0 - 16.0 g/dL Final   HCT  Date Value Ref Range Status  11/13/2015 30.2 (L) 35.0 - 47.0 % Final    Physical Exam:  General: alert, cooperative and appears stated age 45Lochia: appropriate Uterine Fundus: firm Incision: healing well DVT Evaluation: No evidence of DVT seen on physical exam.  Discharge Diagnoses: Term Pregnancy-delivered  Discharge Information: Date: 11/13/2015 Activity: pelvic rest Diet: routine Medications: Ibuprofen, Colace and Iron Condition: stable Instructions: refer to practice specific booklet Discharge to: home Follow-up Information    Christeen DouglasBEASLEY, Kamaron Deskins, MD. Schedule an appointment as soon as possible for a visit in 6 week(s).   Specialty:  Obstetrics and Gynecology Why:  Postpartum follow-up Contact information: [address] [place] [id number] [phone]           Newborn Data: Live born female  Birth Weight: 8 lb 2.2 oz (3690 g) APGAR: 8, 9  Home with mother.  Christeen DouglasBEASLEY, Markon Jares 11/13/2015, 2:29 PM

## 2015-11-13 NOTE — Discharge Instructions (Signed)
Call your doctor for increased pain or vaginal bleeding, temperature above 100.4, depression, or concerns.  No strenuous activity or heavy lifting for 6 weeks.  No intercourse, tampons, douching, or enemas for 6 weeks.  No tub baths-showers only.  No driving for 2 weeks or while taking pain medications.  Continue prenatal vitamin and iron.  Increase calories and fluids while breastfeeding. °

## 2015-11-13 NOTE — Progress Notes (Signed)
Discharge instructions provided.  Pt and sig other verbalize understanding of all instructions and follow-up care.  Pt discharged to home with infant at 1600 on 11/13/15 via wheelchair by CNA. Reynold BowenSusan Paisley Germaine Ripp, RN 11/13/2015 5:49 PM

## 2019-12-27 ENCOUNTER — Observation Stay: Payer: Self-pay

## 2019-12-27 ENCOUNTER — Encounter: Payer: Self-pay | Admitting: Obstetrics and Gynecology

## 2019-12-27 ENCOUNTER — Other Ambulatory Visit: Payer: Self-pay

## 2019-12-27 ENCOUNTER — Observation Stay
Admission: EM | Admit: 2019-12-27 | Discharge: 2019-12-27 | Disposition: A | Discharge status: Home / Self Care | Payer: Self-pay | Source: Home / Self Care | Admitting: Obstetrics and Gynecology

## 2019-12-27 DIAGNOSIS — O0933 Supervision of pregnancy with insufficient antenatal care, third trimester: Secondary | ICD-10-CM

## 2019-12-27 DIAGNOSIS — U071 COVID-19: Secondary | ICD-10-CM | POA: Insufficient documentation

## 2019-12-27 DIAGNOSIS — O98513 Other viral diseases complicating pregnancy, third trimester: Secondary | ICD-10-CM | POA: Insufficient documentation

## 2019-12-27 DIAGNOSIS — R52 Pain, unspecified: Secondary | ICD-10-CM

## 2019-12-27 DIAGNOSIS — Z3A37 37 weeks gestation of pregnancy: Secondary | ICD-10-CM | POA: Insufficient documentation

## 2019-12-27 LAB — GROUP B STREP BY PCR: Group B strep by PCR: NEGATIVE

## 2019-12-27 LAB — URINE DRUG SCREEN, QUALITATIVE (ARMC ONLY)
Amphetamines, Ur Screen: NOT DETECTED
Barbiturates, Ur Screen: NOT DETECTED
Benzodiazepine, Ur Scrn: NOT DETECTED
Cannabinoid 50 Ng, Ur ~~LOC~~: NOT DETECTED
Cocaine Metabolite,Ur ~~LOC~~: NOT DETECTED
MDMA (Ecstasy)Ur Screen: NOT DETECTED
Methadone Scn, Ur: NOT DETECTED
Opiate, Ur Screen: NOT DETECTED
Phencyclidine (PCP) Ur S: NOT DETECTED
Tricyclic, Ur Screen: NOT DETECTED

## 2019-12-27 LAB — HEPATITIS B SURFACE ANTIGEN: Hepatitis B Surface Ag: NONREACTIVE

## 2019-12-27 LAB — BASIC METABOLIC PANEL
Anion gap: 13 (ref 5–15)
BUN: <5 mg/dL — ABNORMAL LOW (ref 6–20)
CO2: 19 mmol/L — ABNORMAL LOW (ref 22–32)
Calcium: 8.6 mg/dL — ABNORMAL LOW (ref 8.9–10.3)
Chloride: 102 mmol/L (ref 98–111)
Creatinine, Ser: 0.49 mg/dL (ref 0.44–1.00)
GFR calc Af Amer: >60 mL/min (ref >60)
GFR calc non Af Amer: >60 mL/min (ref >60)
Glucose, Bld: 81 mg/dL (ref 70–99)
Potassium: 3.7 mmol/L (ref 3.5–5.1)
Sodium: 134 mmol/L — ABNORMAL LOW (ref 135–145)

## 2019-12-27 LAB — URINALYSIS, COMPLETE (UACMP) WITH MICROSCOPIC
Bilirubin Urine: NEGATIVE
Glucose, UA: NEGATIVE mg/dL
Hgb urine dipstick: NEGATIVE
Ketones, ur: 80 mg/dL — AB
Nitrite: NEGATIVE
Protein, ur: 30 mg/dL — AB
Specific Gravity, Urine: 1.014 (ref 1.005–1.030)
pH: 6 (ref 5.0–8.0)

## 2019-12-27 LAB — CBC
HCT: 36.2 % (ref 36.0–46.0)
Hemoglobin: 11.9 g/dL — ABNORMAL LOW (ref 12.0–15.0)
MCH: 27.7 pg (ref 26.0–34.0)
MCHC: 32.9 g/dL (ref 30.0–36.0)
MCV: 84.4 fL (ref 80.0–100.0)
Platelets: 156 10*3/uL (ref 150–400)
RBC: 4.29 MIL/uL (ref 3.87–5.11)
RDW: 15 % (ref 11.5–15.5)
WBC: 3.7 10*3/uL — ABNORMAL LOW (ref 4.0–10.5)
nRBC: 0 % (ref 0.0–0.2)

## 2019-12-27 LAB — RAPID HIV SCREEN (HIV 1/2 AB+AG)
HIV 1/2 Antibodies: NONREACTIVE
HIV-1 P24 Antigen - HIV24: NONREACTIVE

## 2019-12-27 LAB — TYPE AND SCREEN
ABO/RH(D): O POS
Antibody Screen: NEGATIVE

## 2019-12-27 LAB — CHLAMYDIA/NGC RT PCR (ARMC ONLY)
Chlamydia Tr: NOT DETECTED
N gonorrhoeae: NOT DETECTED

## 2019-12-27 LAB — SARS CORONAVIRUS 2 BY RT PCR (HOSPITAL ORDER, PERFORMED IN ~~LOC~~ HOSPITAL LAB): SARS Coronavirus 2: POSITIVE — AB

## 2019-12-27 MED ORDER — SODIUM CHLORIDE 0.9 % IV SOLN
INTRAVENOUS | Status: DC | PRN
  Administered 2019-12-27: 400 mL via INTRAVENOUS

## 2019-12-27 MED ORDER — SODIUM CHLORIDE 0.9 % IV SOLN
1200.0000 mg | Freq: Once | INTRAVENOUS | Status: AC
  Administered 2019-12-27: 1200 mg via INTRAVENOUS
  Filled 2019-12-27: qty 10

## 2019-12-27 MED ORDER — ALBUTEROL SULFATE HFA 108 (90 BASE) MCG/ACT IN AERS
2.0000 | INHALATION_SPRAY | Freq: Once | RESPIRATORY_TRACT | Status: DC | PRN
  Filled 2019-12-27: qty 6.7

## 2019-12-27 MED ORDER — ACETAMINOPHEN 325 MG PO TABS: 650.0000 mg | ORAL_TABLET | ORAL | Status: DC | PRN

## 2019-12-27 MED ORDER — FAMOTIDINE IN NACL 20-0.9 MG/50ML-% IV SOLN
20.0000 mg | Freq: Once | INTRAVENOUS | Status: DC | PRN
  Filled 2019-12-27: qty 50

## 2019-12-27 MED ORDER — DIPHENHYDRAMINE HCL 50 MG/ML IJ SOLN: 50.0000 mg | Freq: Once | INTRAMUSCULAR | Status: DC | PRN

## 2019-12-27 MED ORDER — METHYLPREDNISOLONE SODIUM SUCC 125 MG IJ SOLR
125.0000 mg | Freq: Once | INTRAMUSCULAR | Status: DC | PRN
  Filled 2019-12-27: qty 2

## 2019-12-27 MED ORDER — EPINEPHRINE 0.3 MG/0.3ML IJ SOAJ
0.3000 mg | Freq: Once | INTRAMUSCULAR | Status: DC | PRN
  Filled 2019-12-27: qty 0.3

## 2019-12-27 NOTE — Discharge Summary (Signed)
RN reviewed discharge instructions with patient. Gave patient opportunity for questions. All questions answered at this time. Pt verbalized understanding. Pt discharged home. 

## 2019-12-27 NOTE — Progress Notes (Signed)
Pt covid test came back positive. Dr Bonney Aid informed, droplet precautions were initiated upon pt arrival to unit. Pt informed and MD plans to come discuss poc and treatment options. FHR reassurring, efm and toco d/c'd per Dr. Bonney Aid order. Urine sent for UA and UDS. Pt verbalizes understanding.

## 2019-12-27 NOTE — H&P (Signed)
Obstetric H&P   Chief Complaint: Cough, pelvic pressure  Prenatal Care Provider: none  History of Present Illness: 29 y.o. G2P1001 [redacted]w[redacted]d by 01/14/2020, by Other Basis presenting to L&D for pelvic pressure.  She has known household contact that tested positive for COVID-19.  Her symptom onset was 12/18/2019, she was set up for monoclonal antibody infusion at [facility] on 12/23/2019 but canceled secondary to child care issues.  She denies chest pain or shortness of breath.  Vitals were normal on presentation.    The patient has had no formal prenatal care to date and was planning on a home birth attended by a lay midwife.  Her last delivery she did receive regular prenatal care at [facility] which was resulted in an uncomplicated vaginal delivery following elective delivery.  She was GBS positive with that pregnancy.    +FM, no LOF, no VB, no ctx.    Review of Systems: 10 point review of systems negative unless otherwise noted in HPI  Past Medical History: Patient Active Problem List   Diagnosis Date Noted  . Labor and delivery indication for care or intervention 12/27/2019  . Labor and delivery, indication for care 11/10/2015    Past Surgical History: Past Surgical History:  Procedure Laterality Date  . WISDOM TOOTH EXTRACTION      Past Obstetric History: # 1 - Date: 11/11/15, Sex: Female, Weight: 3690 g, GA: [redacted]w[redacted]d, Delivery: Vaginal, Spontaneous, Apgar1: 8, Apgar5: 9, Living: Living, Birth Comments: None  # 2 - Date: None, Sex: None, Weight: None, GA: None, Delivery: None, Apgar1: None, Apgar5: None, Living: None, Birth Comments: None   Past Gynecologic History:  Family History: History reviewed. No pertinent family history.  Social History: Social History   Socioeconomic History  . Marital status: Married    Spouse name: Not on file  . Number of children: Not on file  . Years of education: Not on file  . Highest education level: Not on file  Occupational History  .  Not on file  Tobacco Use  . Smoking status: Never Smoker  . Smokeless tobacco: Never Used  Substance and Sexual Activity  . Alcohol use: No  . Drug use: No  . Sexual activity: Yes  Other Topics Concern  . Not on file  Social History Narrative  . Not on file   Social Determinants of Health   Financial Resource Strain:   . Difficulty of Paying Living Expenses: Not on file  Food Insecurity:   . Worried About Programme researcher, broadcasting/film/video in the Last Year: Not on file  . Ran Out of Food in the Last Year: Not on file  Transportation Needs:   . Lack of Transportation (Medical): Not on file  . Lack of Transportation (Non-Medical): Not on file  Physical Activity:   . Days of Exercise per Week: Not on file  . Minutes of Exercise per Session: Not on file  Stress:   . Feeling of Stress : Not on file  Social Connections:   . Frequency of Communication with Friends and Family: Not on file  . Frequency of Social Gatherings with Friends and Family: Not on file  . Attends Religious Services: Not on file  . Active Member of Clubs or Organizations: Not on file  . Attends Banker Meetings: Not on file  . Marital Status: Not on file  Intimate Partner Violence:   . Fear of Current or Ex-Partner: Not on file  . Emotionally Abused: Not on file  . Physically  Abused: Not on file  . Sexually Abused: Not on file    Medications: Prior to Admission medications   Medication Sig Start Date End Date Taking? Authorizing Provider  docusate sodium (COLACE) 100 MG capsule Take 1 capsule (100 mg total) by mouth daily as needed for mild constipation. 11/13/15  Yes Christeen Douglas, MD  Prenatal Vit-Fe Fumarate-FA (PRENATAL VITAMIN PO) Take by mouth daily.   Yes [provider]  ferrous sulfate 325 (65 FE) MG tablet Take 1 tablet (325 mg total) by mouth 2 (two) times daily with a meal. For anemia, take with Vitamin C 11/13/15 01/12/16  Christeen Douglas, MD    Allergies: No Known  Allergies  Physical Exam: Vitals: Blood pressure 115/78, pulse (!) 126, temperature 98.5 F (36.9 C), temperature source Oral, resp. rate 18, height 5\' 7"  (1.702 m), weight 108.9 kg, SpO2 98 %, unknown if currently breastfeeding.  FHT: 130, moderate, +accels, no decels Toco: irregular contractions on tocometer  General: NAD HEENT: normocephalic, anicteric Pulmonary: No increased work of breathing, coughing intermittently Cardiovascular: RRR+ Genitourinary: deferred Extremities: no edema, erythema, or tenderness Neurologic: Grossly intact Psychiatric: mood appropriate, affect full  Labs: Results for orders placed or performed during the hospital encounter of 12/27/19 (from the past 24 hour(s))  Basic metabolic panel     Status: Abnormal   Collection Time: 12/27/19  2:18 PM  Result Value Ref Range   Sodium 134 (L) 135 - 145 mmol/L   Potassium 3.7 3.5 - 5.1 mmol/L   Chloride 102 98 - 111 mmol/L   CO2 19 (L) 22 - 32 mmol/L   Glucose, Bld 81 70 - 99 mg/dL   BUN <5 (L) 6 - 20 mg/dL   Creatinine, Ser 12/29/19 0.44 - 1.00 mg/dL   Calcium 8.6 (L) 8.9 - 10.3 mg/dL   GFR calc non Af Amer >60 >60 mL/min   GFR calc Af Amer >60 >60 mL/min   Anion gap 13 5 - 15  CBC     Status: Abnormal   Collection Time: 12/27/19  2:18 PM  Result Value Ref Range   WBC 3.7 (L) 4.0 - 10.5 K/uL   RBC 4.29 3.87 - 5.11 MIL/uL   Hemoglobin 11.9 (L) 12.0 - 15.0 g/dL   HCT 12/29/19 36 - 46 %   MCV 84.4 80.0 - 100.0 fL   MCH 27.7 26.0 - 34.0 pg   MCHC 32.9 30.0 - 36.0 g/dL   RDW 59.5 63.8 - 75.6 %   Platelets 156 150 - 400 K/uL   nRBC 0.0 0.0 - 0.2 %  Urinalysis, Complete w Microscopic     Status: Abnormal   Collection Time: 12/27/19  2:18 PM  Result Value Ref Range   Color, Urine YELLOW (A) YELLOW   APPearance CLOUDY (A) CLEAR   Specific Gravity, Urine 1.014 1.005 - 1.030   pH 6.0 5.0 - 8.0   Glucose, UA NEGATIVE NEGATIVE mg/dL   Hgb urine dipstick NEGATIVE NEGATIVE   Bilirubin Urine NEGATIVE NEGATIVE    Ketones, ur 80 (A) NEGATIVE mg/dL   Protein, ur 30 (A) NEGATIVE mg/dL   Nitrite NEGATIVE NEGATIVE   Leukocytes,Ua TRACE (A) NEGATIVE   RBC / HPF 0-5 0 - 5 RBC/hpf   WBC, UA 11-20 0 - 5 WBC/hpf   Bacteria, UA RARE (A) NONE SEEN   Squamous Epithelial / LPF 11-20 0 - 5   Mucus PRESENT    Hyaline Casts, UA PRESENT   SARS Coronavirus 2 by RT PCR (hospital order, performed in  [facility] hospital lab) Nasopharyngeal Nasopharyngeal Swab     Status: Abnormal   Collection Time: 12/27/19  2:52 PM   Specimen: Nasopharyngeal Swab  Result Value Ref Range   SARS Coronavirus 2 POSITIVE (A) NEGATIVE  Rapid HIV screen (HIV 1/2 Ab+Ag)     Status: None   Collection Time: 12/27/19  3:52 PM  Result Value Ref Range   HIV-1 P24 Antigen - HIV24 NON REACTIVE NON REACTIVE   HIV 1/2 Antibodies NON REACTIVE NON REACTIVE   Interpretation (HIV Ag Ab)      A non reactive test result means that HIV 1 or HIV 2 antibodies and HIV 1 p24 antigen were not detected in the specimen.  Type and screen [facility]     Status: None   Collection Time: 12/27/19  3:52 PM  Result Value Ref Range   ABO/RH(D) O POS    Antibody Screen NEG    Sample Expiration      12/30/2019,2359 Performed at [facility], [address] [place], [place] [address]   Hepatitis B surface antigen     Status: None   Collection Time: 12/27/19  3:52 PM  Result Value Ref Range   Hepatitis B Surface Ag NON REACTIVE NON REACTIVE   US OB Comp + 14 Wk  Result Date: 12/27/2019 CLINICAL DATA:  29 year old pregnant female, COVID positive, with no prenatal care. EXAM: OBSTETRICAL ULTRASOUND >14 WKS FINDINGS: Number of Fetuses: 1 Heart Rate:  143 bpm Movement: Yes Presentation: Cephalic Previa: No Placental Location: Posterior Amniotic Fluid (Subjective): Decreased Amniotic Fluid (Objective): Vertical pocket = 2.4cm AFI = 7.2 cm (5%ile= 7.5 cm, 95%= 24.4 cm for 37 wks) FETAL BIOMETRY BPD: 9.7cm 39w 3d HC:   35.5cm 41w 4d AC:    33.3cm 37w 2d FL:   7.2cm 37w 0d Current Mean GA: 38w 6d [place] EDC: 01/04/2020 Assigned GA:  37w 3d Assigned EDC: 01/14/2020 Estimated Fetal Weight:  3,342g 71%ile FETAL ANATOMY Lateral Ventricles: Not visualized Thalami/CSP: Not visualized Posterior Fossa:  Not visualized Nuchal Region: Not visualized Upper Lip: Not visualized Spine: Appears normal 4 Chamber Heart on Left: Not visualized LVOT: Not visualized RVOT: Appears normal Stomach on Left: Appears normal 3 Vessel Cord: Appears normal Cord Insertion site: Not visualized Kidneys: Appears normal Bladder: Appears normal Extremities: Not visualized Sex: Female external genitalia Technically difficult due to: Advanced gestational age, maternal habitus and fetal position. Maternal Findings: Cervix:  Not visualized on these transabdominal views. IMPRESSION: 1. Single living intrauterine gestation in cephalic lie at 38 weeks 6 days by average ultrasound age, compared to the expected gestational age of [redacted] weeks 3 days by assigned dating. 2. Estimated fetal weight 3342 g, at the 71st percentile for expected gestational age. 3. Borderline oligohydramnios.  AFI 7.2. 4. Otherwise no fetal or maternal abnormalities detected, with significant limitations as detailed due to advanced gestational age. Electronically Signed   By: Delbert Phenix M.D.   On: 12/27/2019 17:10   US Abdomen Limited RUQ  Result Date: 12/27/2019 CLINICAL DATA:  Right upper quadrant pain for several months. The patient is [redacted] weeks pregnant. COVID-19 positive patient. EXAM: ULTRASOUND ABDOMEN LIMITED RIGHT UPPER QUADRANT COMPARISON:  None. FINDINGS: Gallbladder: Multiple mobile stones are seen in the gallbladder. No wall thickening, pericholecystic fluid, or Murphy's sign. Common bile duct: Diameter: 4 mm Liver: No focal lesion identified. Within normal limits in parenchymal echogenicity. Portal vein is patent on color Doppler imaging with normal direction of blood flow towards the liver. Other: Significant  right-sided hydronephrosis is identified. Evaluation of the right kidney was limited on this right upper quadrant ultrasound however. IMPRESSION: 1. Cholelithiasis without wall thickening, pericholecystic fluid, or Murphy's sign. 2. Significant hydronephrosis seen on limited views of the right kidney. Given the patient's pregnant status, the hydronephrosis could be due to the gravid uterus compressing the ureter. However, the definitive cause for hydronephrosis cannot be determined on this study. Recommend clinical correlation. Electronically Signed   By: Gerome Samavid  Williams III M.D   On: 12/27/2019 17:06    Assessment: 29 y.o. G2P1001 6527w3d by 01/14/2020, by Other Basis COVID-19 positive and no prenatal care  Plan: 1) COVID 19 - day 9 from symptom onset.  Still candidate for MAB infusion.  Discussed pros and cons of infusion.  She will discuss with her husband but is leaning towards proceeding  2) Fetus - EFW 5853w2d by ultrasound today, cephalic presentation  3) PNL - routine prenatal labs were collected at today visit including GC/CT cultures and GBS  4) RUQ discomfort - cholelithiasis no evidence of colicystitis discussed if symptoms persist would require surgical consultation postpartum  5) Immunization History -  There is no immunization history for the selected administration types on file for this patient.  6) Disposition - home following transfusion, if decided against infusion clear for discharge  Vena AustriaAndreas Tiffiany Beadles, MD, Merlinda FrederickFACOG [facility], [facility] 12/27/2019, 7:08 PM

## 2019-12-27 NOTE — Progress Notes (Signed)
Fetal monitoring initiated. MAB infusion started. RN at bedside. VSS currently. Will continue to monitor.

## 2019-12-27 NOTE — Progress Notes (Signed)
Pharmacy COVID-19 Monoclonal Antibody Screening  [name] was identified as being not hospitalized with symptoms from Covid-19 on admission but an incidental positive PCR has been documented.  The patient may qualify for the use of monoclonal antibodies (mAB) for COVID-19 viral infection to prevent worsening symptoms stemming from Covid-19 infection.  The patient was identified based on a positive COVID-19 PCR and not requiring the use of supplemental oxygen at this time.  This patient meets the FDA criteria for Emergency Use Authorization of casirivimab/imdevimab.  Has a (+) direct SARS-CoV-2 viral test result  Is NOT hospitalized due to COVID-19  Is within 10 days of symptom onset  Has at least one of the high risk factor(s) for progression to severe COVID-19 and/or hospitalization as defined in EUA.  Specific high risk criteria : Pregnancy  Additionally: The patient HAS had a positive COVID-19 PCR in the last 90 days. D/w Dr. Bonney Aid symptom severity and was told they were mild. Per MD, patient's main reason for being seen is pelvic pressure. He also stated that patient was supposed to get the infusion at [facility] but patient was not allowed to bring her daughter.  The patient is unvaccinated against COVID-19, per [place] from labor and deliver.  Since the patient is unvaccinated and meets high risk criteria, the patient is eligible for mAB administration.   This eligibility and indication for treatment was discussed with the patient's physician: Per Dr. Bonney Aid, patient consented use of Regen-Cov.  Plan: Based on the above discussion, it was decided that the patient will receive one dose of mAB combination.Casirivimab/imdevimab has been ordered. Pharmacy will coordinate administration timing with patient's nurse. Recommended infusion monitoring parameters communicated to the nursing team.   [name] 12/27/2019  9:59 PM

## 2019-12-27 NOTE — ED Triage Notes (Signed)
Pt states that she is 37 1/[redacted] weeks pregnant, states that she has been sick for the past 9 days and wonders if she has covid due to all the nasal congestion she has, states that she was going to urgent care but nearly passed out and the line was long at the urgent care and she was afraid she would pass out waiting  Pt states that the baby has been active as usual and states that she has had some cramps in her abd and wants to make sure the baby is ok

## 2019-12-27 NOTE — OB Triage Note (Signed)
Pt presents to ED with covid 19 complaints. States she started feeling bad since 9/10 with covid 19 symptoms fever, congestion, cough, body aches, chills, vertigo, and loss of taste and smell Monday. Called [facility] on Tuesday and inquired about antibody infusion. Was scheduled for infusion Wednesday but was unable to go to that visit because she had no one to watch her daughter. Pt also complains of pressure, in pelis that is worse because of coughing. Pt reports good fetal movement, denies bleeding, leaking fluid and contractions. Pt reports being concerned about baby. Pt has been seeing a lay midwiife for her prenatal care and reports that everything has been normal with this pregnancy and she is anticipating a home birth. Reports having an [place] at 20 weeks and was told the placenta was "low, but expected it to move up", pt further states US RN was "npt concerned about it". Pt states EDD 01/14/20 by [place] and LMP. EFM and toco applied and explained. Plan to monitoer fetal and maternal well being. Labs, urine ekg and covid obtained in the ED. Dr Bonney Aid who is unassigned OB aware of pt compliant and situation.

## 2019-12-28 ENCOUNTER — Telehealth: Payer: Self-pay

## 2019-12-28 LAB — RPR: RPR Ser Ql: NONREACTIVE

## 2019-12-28 NOTE — Telephone Encounter (Signed)
Pt aware via vm 

## 2019-12-28 NOTE — Telephone Encounter (Signed)
Pt calling today stating she has been throwing up a lot since getting the infusions yesterday for COVID. She is wondering if this would be a reaction to this. Please advise

## 2019-12-28 NOTE — Telephone Encounter (Signed)
Yes the infusion may cause nausea.  If nausea is severe and she feel like she need to potential receive IV fluid re-present to hospital

## 2019-12-29 ENCOUNTER — Telehealth: Payer: Self-pay

## 2019-12-29 LAB — RUBELLA SCREEN: Rubella: 8.49 index (ref >0.99)

## 2019-12-29 LAB — VARICELLA ZOSTER ANTIBODY, IGG: Varicella IgG: 1280 index (ref >165)

## 2019-12-29 NOTE — Telephone Encounter (Signed)
Pt aware.

## 2019-12-29 NOTE — Telephone Encounter (Signed)
Pt wants to know if AMS knows how long nausea and vomiting is a side affect from the infusion, Should she have zofran called in, Yesterday she vomited 6-8 times, today feels nauseous but has not ate yet. Please advise

## 2019-12-29 NOTE — Telephone Encounter (Signed)
Usually confined to first 24-hrs

## 2019-12-30 ENCOUNTER — Encounter: Payer: Self-pay | Admitting: Emergency Medicine

## 2019-12-30 ENCOUNTER — Other Ambulatory Visit: Payer: Self-pay

## 2019-12-30 ENCOUNTER — Emergency Department: Payer: Managed Care, Other (non HMO)

## 2019-12-30 ENCOUNTER — Inpatient Hospital Stay
Admission: EM | Admit: 2019-12-30 | Discharge: 2020-01-01 | DRG: 831 | Disposition: A | Discharge status: Home / Self Care | Payer: HRSA Program | Attending: Internal Medicine | Admitting: Internal Medicine

## 2019-12-30 DIAGNOSIS — J1282 Pneumonia due to coronavirus disease 2019: Secondary | ICD-10-CM

## 2019-12-30 DIAGNOSIS — O4103X0 Oligohydramnios, third trimester, not applicable or unspecified: Secondary | ICD-10-CM | POA: Diagnosis present

## 2019-12-30 DIAGNOSIS — J45909 Unspecified asthma, uncomplicated: Secondary | ICD-10-CM | POA: Diagnosis present

## 2019-12-30 DIAGNOSIS — O2662 Liver and biliary tract disorders in childbirth: Secondary | ICD-10-CM | POA: Diagnosis present

## 2019-12-30 DIAGNOSIS — O98513 Other viral diseases complicating pregnancy, third trimester: Principal | ICD-10-CM | POA: Diagnosis present

## 2019-12-30 DIAGNOSIS — U071 COVID-19: Secondary | ICD-10-CM | POA: Diagnosis present

## 2019-12-30 DIAGNOSIS — R079 Chest pain, unspecified: Secondary | ICD-10-CM

## 2019-12-30 DIAGNOSIS — R0902 Hypoxemia: Secondary | ICD-10-CM | POA: Diagnosis present

## 2019-12-30 DIAGNOSIS — Z3493 Encounter for supervision of normal pregnancy, unspecified, third trimester: Secondary | ICD-10-CM

## 2019-12-30 DIAGNOSIS — R1110 Vomiting, unspecified: Secondary | ICD-10-CM

## 2019-12-30 DIAGNOSIS — Z3A37 37 weeks gestation of pregnancy: Secondary | ICD-10-CM

## 2019-12-30 DIAGNOSIS — O9952 Diseases of the respiratory system complicating childbirth: Secondary | ICD-10-CM | POA: Diagnosis present

## 2019-12-30 DIAGNOSIS — O9962 Diseases of the digestive system complicating childbirth: Secondary | ICD-10-CM | POA: Diagnosis present

## 2019-12-30 LAB — TROPONIN I (HIGH SENSITIVITY)
Troponin I (High Sensitivity): 13 ng/L (ref <18)
Troponin I (High Sensitivity): 7 ng/L (ref <18)

## 2019-12-30 LAB — CBC WITH DIFFERENTIAL/PLATELET
Abs Immature Granulocytes: 0.16 10*3/uL — ABNORMAL HIGH (ref 0.00–0.07)
Basophils Absolute: 0 10*3/uL (ref 0.0–0.1)
Basophils Relative: 0 %
Eosinophils Absolute: 0 10*3/uL (ref 0.0–0.5)
Eosinophils Relative: 0 %
HCT: 37.5 % (ref 36.0–46.0)
Hemoglobin: 12.4 g/dL (ref 12.0–15.0)
Immature Granulocytes: 3 %
Lymphocytes Relative: 19 %
Lymphs Abs: 1.2 10*3/uL (ref 0.7–4.0)
MCH: 27.7 pg (ref 26.0–34.0)
MCHC: 33.1 g/dL (ref 30.0–36.0)
MCV: 83.7 fL (ref 80.0–100.0)
Monocytes Absolute: 0.5 10*3/uL (ref 0.1–1.0)
Monocytes Relative: 7 %
Neutro Abs: 4.6 10*3/uL (ref 1.7–7.7)
Neutrophils Relative %: 71 %
Platelets: 182 10*3/uL (ref 150–400)
RBC: 4.48 MIL/uL (ref 3.87–5.11)
RDW: 15.4 % (ref 11.5–15.5)
WBC: 6.4 10*3/uL (ref 4.0–10.5)
nRBC: 0 % (ref 0.0–0.2)

## 2019-12-30 LAB — CBC
HCT: 39.6 % (ref 36.0–46.0)
Hemoglobin: 13 g/dL (ref 12.0–15.0)
MCH: 27.9 pg (ref 26.0–34.0)
MCHC: 32.8 g/dL (ref 30.0–36.0)
MCV: 85 fL (ref 80.0–100.0)
Platelets: 203 10*3/uL (ref 150–400)
RBC: 4.66 MIL/uL (ref 3.87–5.11)
RDW: 15.2 % (ref 11.5–15.5)
WBC: 7.2 10*3/uL (ref 4.0–10.5)
nRBC: 0 % (ref 0.0–0.2)

## 2019-12-30 LAB — COMPREHENSIVE METABOLIC PANEL
ALT: 21 U/L (ref 0–44)
AST: 28 U/L (ref 15–41)
Albumin: 2.9 g/dL — ABNORMAL LOW (ref 3.5–5.0)
Alkaline Phosphatase: 158 U/L — ABNORMAL HIGH (ref 38–126)
Anion gap: 14 (ref 5–15)
BUN: 7 mg/dL (ref 6–20)
CO2: 15 mmol/L — ABNORMAL LOW (ref 22–32)
Calcium: 9.5 mg/dL (ref 8.9–10.3)
Chloride: 105 mmol/L (ref 98–111)
Creatinine, Ser: 0.67 mg/dL (ref 0.44–1.00)
GFR calc Af Amer: >60 mL/min (ref >60)
GFR calc non Af Amer: >60 mL/min (ref >60)
Glucose, Bld: 78 mg/dL (ref 70–99)
Potassium: 4.3 mmol/L (ref 3.5–5.1)
Sodium: 134 mmol/L — ABNORMAL LOW (ref 135–145)
Total Bilirubin: 1.8 mg/dL — ABNORMAL HIGH (ref 0.3–1.2)
Total Protein: 7.2 g/dL (ref 6.5–8.1)

## 2019-12-30 LAB — BASIC METABOLIC PANEL
Anion gap: 13 (ref 5–15)
BUN: 7 mg/dL (ref 6–20)
CO2: 15 mmol/L — ABNORMAL LOW (ref 22–32)
Calcium: 9.4 mg/dL (ref 8.9–10.3)
Chloride: 108 mmol/L (ref 98–111)
Creatinine, Ser: 0.67 mg/dL (ref 0.44–1.00)
GFR calc Af Amer: >60 mL/min (ref >60)
GFR calc non Af Amer: >60 mL/min (ref >60)
Glucose, Bld: 82 mg/dL (ref 70–99)
Potassium: 4 mmol/L (ref 3.5–5.1)
Sodium: 136 mmol/L (ref 135–145)

## 2019-12-30 LAB — FIBRIN DERIVATIVES D-DIMER (ARMC ONLY): Fibrin derivatives D-dimer (ARMC): 1833.06 ng/mL (FEU) — ABNORMAL HIGH (ref 0.00–499.00)

## 2019-12-30 MED ORDER — ZINC SULFATE 220 (50 ZN) MG PO CAPS
220.0000 mg | ORAL_CAPSULE | Freq: Every day | ORAL | Status: DC
  Filled 2019-12-30 (×3): qty 1

## 2019-12-30 MED ORDER — ASCORBIC ACID 500 MG PO TABS
500.0000 mg | ORAL_TABLET | Freq: Every day | ORAL | Status: DC
  Administered 2019-12-30 – 2019-12-31 (×2): 500 mg via ORAL
  Filled 2019-12-30 (×3): qty 1

## 2019-12-30 MED ORDER — ALBUTEROL SULFATE HFA 108 (90 BASE) MCG/ACT IN AERS
2.0000 | INHALATION_SPRAY | Freq: Four times a day (QID) | RESPIRATORY_TRACT | Status: DC
  Administered 2019-12-30 – 2020-01-01 (×8): 2 via RESPIRATORY_TRACT
  Filled 2019-12-30 (×2): qty 6.7

## 2019-12-30 MED ORDER — ADULT MULTIVITAMIN W/MINERALS CH
1.0000 | ORAL_TABLET | Freq: Every day | ORAL | Status: DC
  Administered 2019-12-31: 1 via ORAL
  Filled 2019-12-30 (×3): qty 1

## 2019-12-30 MED ORDER — SODIUM CHLORIDE 0.9 % IV SOLN
200.0000 mg | Freq: Once | INTRAVENOUS | Status: AC
  Administered 2019-12-30: 200 mg via INTRAVENOUS
  Filled 2019-12-30: qty 200

## 2019-12-30 MED ORDER — LACTATED RINGERS IV BOLUS
500.0000 mL | Freq: Once | INTRAVENOUS | Status: AC
  Administered 2019-12-30: 500 mL via INTRAVENOUS

## 2019-12-30 MED ORDER — LACTATED RINGERS IV SOLN: INTRAVENOUS | Status: DC

## 2019-12-30 MED ORDER — ENOXAPARIN SODIUM 40 MG/0.4ML ~~LOC~~ SOLN
40.0000 mg | SUBCUTANEOUS | Status: DC
  Filled 2019-12-30 (×2): qty 0.4

## 2019-12-30 MED ORDER — DEXAMETHASONE SODIUM PHOSPHATE 10 MG/ML IJ SOLN
10.0000 mg | Freq: Once | INTRAMUSCULAR | Status: DC
  Filled 2019-12-30: qty 1

## 2019-12-30 MED ORDER — IOHEXOL 350 MG/ML SOLN
75.0000 mL | Freq: Once | INTRAVENOUS | Status: AC | PRN
  Administered 2019-12-30: 75 mL via INTRAVENOUS

## 2019-12-30 MED ORDER — ONDANSETRON HCL 4 MG PO TABS
4.0000 mg | ORAL_TABLET | Freq: Four times a day (QID) | ORAL | Status: DC | PRN
  Filled 2019-12-30: qty 1

## 2019-12-30 MED ORDER — ACETAMINOPHEN 325 MG PO TABS
650.0000 mg | ORAL_TABLET | Freq: Four times a day (QID) | ORAL | Status: DC | PRN
  Administered 2019-12-30: 650 mg via ORAL
  Filled 2019-12-30: qty 2

## 2019-12-30 MED ORDER — SODIUM CHLORIDE 0.9 % IV SOLN
100.0000 mg | Freq: Every day | INTRAVENOUS | Status: DC
  Filled 2019-12-30 (×2): qty 20

## 2019-12-30 MED ORDER — DEXAMETHASONE SODIUM PHOSPHATE 10 MG/ML IJ SOLN
6.0000 mg | INTRAMUSCULAR | Status: DC
  Filled 2019-12-30: qty 0.6

## 2019-12-30 MED ORDER — GUAIFENESIN-DM 100-10 MG/5ML PO SYRP
10.0000 mL | ORAL_SOLUTION | ORAL | Status: DC | PRN
  Administered 2019-12-30 – 2020-01-01 (×10): 10 mL via ORAL
  Filled 2019-12-30 (×13): qty 10

## 2019-12-30 MED ORDER — ONDANSETRON HCL 4 MG/2ML IJ SOLN
4.0000 mg | Freq: Four times a day (QID) | INTRAMUSCULAR | Status: DC | PRN
  Administered 2019-12-30 – 2019-12-31 (×5): 4 mg via INTRAVENOUS
  Filled 2019-12-30 (×6): qty 2

## 2019-12-30 NOTE — Progress Notes (Signed)
RN spoke to Respiratory Therapist to inform of COVID + patient on our unit. Discussed patient's plan of care, oxygen, and interventions being done. No new recommendations given.

## 2019-12-30 NOTE — ED Triage Notes (Signed)
Pt [redacted] weeks pregnant, COVID + 11 days out, received Antibodies on 9/19 at 2130. Since pt reports N/V and this AM chest pain.

## 2019-12-30 NOTE — Progress Notes (Signed)
PROGRESS NOTE    Chelsey Fernandez  EGB:[id number] DOB: Jun 20, 1990 DOA: 12/30/2019 PCP: Margaretann Loveless, PA-C   Brief Narrative:  Chelsey Fernandez is a 29 y.o. female at [redacted] weeks gestation, COVID-19 positive who received a monoclonal antibodies on 12/27/2019 who presents to the emergency room after she was awakened with retrosternal chest pain.  Patient states that since receiving monoclonal antibodies 2 days prior she has had nausea and vomiting.  She is also had mild shortness of breath.  No cough or fever.  Denies abdominal pain or diarrhea.  ED Course: On arrival in the emergency room she was tachycardic and tachypneic with O2 sat in the mid 90s on room air however desatted to 88% with lying flat and with activity.  Initial blood work was unremarkable she had a CTA PE protocol that showed multifocal pulmonary infiltrates in keeping with atypical infection which can be seen with COVID-19 pneumonia.  The emergency room provider spoke with OB/GYN who requested admission to the medical service on the medical floor.  Did not think patient needed to be on the mother-baby or OB/GYN floor .hospitalist consulted for admission EKG as reviewed by me : Normal sinus rhythm no acute ST-T wave changes   Assessment & Plan:  Principal Problem:   Chest pain Active Problems:   Pneumonia due to COVID-19 virus   Hypoxia   Vomiting   Third trimester pregnancy Chest pain -CTA chest ruled out PE.  Showed multifocal pneumonia --Cont Remdesivir vit,zinc,albuterol and vitamins.     Pneumonia due to COVID-19 virus with hypoxia -Remdesivir, albuterol, antitussives, vitamins. -pt is not hypoxic is started on 1L [place] oxygen for comfort.     Vomiting -Antiemetics Prn only.  Third trimester pregnancy -OB/GYN consult -Case discussed with Dr. Tiburcio Pea who prefers that patient be on the medical floor(as opposed to [facility] delivery) but should the need arise will transfer immediately to labor  delivery.   DVT prophylaxis: Pt declined. Code Status: Full Code Family Communication: None at bedside Disposition Plan: Home  Status is: Inpatient  Remains inpatient appropriate because:Inpatient level of care appropriate due to severity of illness  Dispo: The patient is from: Home              Anticipated d/c is to: Home              Anticipated d/c date is: > 3 days              Patient currently is medically stable to d/c. Consultants:   None  Procedures: None  Antimicrobials:  Anti-infectives (From admission, onward)   Start     Dose/Rate Route Frequency Ordered Stop   12/31/19 1000  remdesivir 100 mg in sodium chloride 0.9 % 100 mL IVPB       "Followed by" Linked Group Details   100 mg 200 mL/hr over 30 Minutes Intravenous Daily 12/30/19 0636 01/04/20 0959   12/30/19 0645  remdesivir 200 mg in sodium chloride 0.9% 250 mL IVPB       "Followed by" Linked Group Details   200 mg 580 mL/hr over 30 Minutes Intravenous Once 12/30/19 0636 12/30/19 0917     Subjective: Pt seen today for Covid-19 infection pneumonia.  Pt is resting in bed on 1.5 L and sating at 97%.  Objective: Vitals:   12/30/19 1655 12/30/19 1659 12/30/19 1700 12/30/19 1705  BP:      Pulse:    87  Resp:      Temp:  TempSrc:      SpO2: 97% 99% 99% 100%  Weight:      Height:        Intake/Output Summary (Last 24 hours) at 12/30/2019 1712 Last data filed at 12/30/2019 1540 Gross per 24 hour  Intake --  Output 200 ml  Net -200 ml   Filed Weights   12/30/19 1118  Weight: 108.9 kg    Examination: Blood pressure 116/81, pulse 82, temperature (!) 97.5 F (36.4 C), temperature source Oral, resp. rate 19, height 5\' 7"  (1.702 m), weight 108.9 kg, SpO2 97 %, unknown if currently breastfeeding. General exam: Appears calm and comfortable  Respiratory system: Clear to auscultation. Respiratory effort normal. Cardiovascular system: S1 & S2 heard, RRR. No JVD, murmurs, rubs, gallops or clicks. No  pedal edema. Gastrointestinal system: Abdomen is nondistended, soft and nontender. No organomegaly or masses felt. Normal bowel sounds heard. Central nervous system: Alert and oriented. No focal neurological deficits. Extremities: Symmetric 5 x 5 power. Skin: No rashes, lesions or ulcers Psychiatry: Judgement and insight appear normal. Mood & affect appropriate.   Data Reviewed: I have personally reviewed following labs and imaging studies. No intake/output data recorded. Total I/O In: -  Out: 200 [Urine:200] Lab Results  Component Value Date   CREATININE 0.67 12/30/2019   CREATININE 0.67 12/30/2019   CREATININE 0.49 12/27/2019   CBC: Recent Labs  Lab 12/27/19 1418 12/30/19 0245 12/30/19 1011  WBC 3.7* 7.2 6.4  NEUTROABS  --   --  4.6  HGB 11.9* 13.0 12.4  HCT 36.2 39.6 37.5  MCV 84.4 85.0 83.7  PLT 156 203 182   Basic Metabolic Panel: Recent Labs  Lab 12/27/19 1418 12/30/19 0245 12/30/19 1011  NA 134* 136 134*  K 3.7 4.0 4.3  CL 102 108 105  CO2 19* 15* 15*  GLUCOSE 81 82 78  BUN <5* 7 7  CREATININE 0.49 0.67 0.67  CALCIUM 8.6* 9.4 9.5   GFR: Estimated Creatinine Clearance: 131.9 mL/min (by C-G formula based on SCr of 0.67 mg/dL).   Liver Function Tests: Recent Labs  Lab 12/30/19 1011  AST 28  ALT 21  ALKPHOS 158*  BILITOT 1.8*  PROT 7.2  ALBUMIN 2.9*   Recent Results (from the past 240 hour(s))  SARS Coronavirus 2 by RT PCR (hospital order, performed in [facility] hospital lab) Nasopharyngeal Nasopharyngeal Swab     Status: Abnormal   Collection Time: 12/27/19  2:52 PM   Specimen: Nasopharyngeal Swab  Result Value Ref Range Status   SARS Coronavirus 2 POSITIVE (A) NEGATIVE Final    Comment: RESULT CALLED TO, READ BACK BY AND VERIFIED WITH: SARAH APEL ON 12/27/19 AT 1735 QSD (NOTE) SARS-CoV-2 target nucleic acids are DETECTED  SARS-CoV-2 RNA is generally detectable in upper respiratory specimens  during the acute phase of infection.  Positive  results are indicative  of the presence of the identified virus, but do not rule out bacterial infection or co-infection with other pathogens not detected by the test.  Clinical correlation with patient history and  other diagnostic information is necessary to determine patient infection status.  The expected result is negative.  Fact Sheet for Patients:   12/29/19   Fact Sheet for Healthcare Providers:   BoilerBrush.com.cy    This test is not yet approved or cleared by the https://pope.com/ FDA and  has been authorized for detection and/or diagnosis of SARS-CoV-2 by FDA under an Emergency Use Authorization (EUA).  This EUA will remain in effect (meaning  this tes t can be used) for the duration of  the COVID-19 declaration under Section 564(b)(1) of the Act, 21 U.S.C. section 360-bbb-3(b)(1), unless the authorization is terminated or revoked sooner.  Performed at [facility], [address] [place], [place] [address]   Chlamydia/NGC rt PCR [facility] only)     Status: None   Collection Time: 12/27/19  7:40 PM   Specimen: Cervical/Vaginal swab; Genital  Result Value Ref Range Status   Specimen source GC/Chlam ENDOCERVICAL  Final   Chlamydia Tr NOT DETECTED NOT DETECTED Final   N gonorrhoeae NOT DETECTED NOT DETECTED Final    Comment: (NOTE) This CT/NG assay has not been evaluated in patients with a history of  hysterectomy. Performed at [facility], [address] [place], [place] [address]   Group B strep by PCR     Status: None   Collection Time: 12/27/19  7:40 PM   Specimen: Vaginal/Rectal; Genital  Result Value Ref Range Status   Group B strep by PCR NEGATIVE NEGATIVE Final    Comment: (NOTE) Intrapartum testing with Xpert GBS assay should be used as an adjunct to other methods available and not used to replace antepartum testing (at 35-[redacted] weeks gestation). Performed at [facility], [address] [place], [place] [address]      Radiology Studies: CT Angio Chest PE W and/or Wo Contrast  Result Date: 12/30/2019 CLINICAL DATA:  Pregnant, COVID pneumonia, dyspnea EXAM: CT ANGIOGRAPHY CHEST WITH CONTRAST TECHNIQUE: Multidetector CT imaging of the chest was performed using the standard protocol during bolus administration of intravenous contrast. Multiplanar CT image reconstructions and MIPs were obtained to evaluate the vascular anatomy. CONTRAST:  64mL OMNIPAQUE IOHEXOL 350 MG/ML SOLN COMPARISON:  None. FINDINGS: Cardiovascular: There is mild respiratory motion artifact, however, there is adequate opacification of the pulmonary arterial tree through the segmental level. There is no intraluminal filling defect identified to suggest acute pulmonary embolism. The central pulmonary arteries are of normal caliber. No significant coronary artery calcification. Cardiac size within normal limits. No pericardial effusion. Thoracic aorta is normal. Mediastinum/Nodes: No pathologic thoracic adenopathy. Small hiatal hernia. Lungs/Pleura: There are multifocal airspace infiltrates seen scattered asymmetrically throughout the lungs in keeping with acute infection or inflammation. The findings can be seen with, but are not prototypical of COVID 19 pneumonia. No pneumothorax or pleural effusion. Central airways are widely patent. Upper Abdomen: No acute abnormality. Musculoskeletal: No chest wall abnormality. No acute or significant osseous findings. Review of the MIP images confirms the above findings. IMPRESSION: Multifocal pulmonary infiltrates in keeping with atypical infection or inflammation in the acute setting. The findings can be seen with, but are not prototypical of COVID 19 pneumonia. Electronically Signed   By: Helyn Numbers MD   On: 12/30/2019 04:07   Scheduled Meds:  albuterol  2 puff Inhalation Q6H   vitamin C  500 mg Oral Daily   dexamethasone (DECADRON) injection  10 mg Intravenous  Once   [START ON 12/31/2019] dexamethasone (DECADRON) injection  6 mg Intravenous Q24H   enoxaparin (LOVENOX) injection  40 mg Subcutaneous Q24H   multivitamin with minerals  1 tablet Oral Daily   zinc sulfate  220 mg Oral Daily   Continuous Infusions:  lactated ringers     [START ON 12/31/2019] remdesivir 100 mg in NS 100 mL       LOS: 0 days   Gertha Calkin, MD [facility] Pager [phone] If 7PM-7AM, please contact night-coverage [email or web address] Password [facility] 12/30/2019, 5:12 PM

## 2019-12-30 NOTE — Progress Notes (Signed)
RN instructed patient on importance of incentive spirometer. RN set up incentive spirometer for patient and patient used as instructed. RN encouraged patient to use every 2 hours.

## 2019-12-30 NOTE — Progress Notes (Signed)
Remdesivir - Pharmacy Brief Note   O:  ALT:  CXR:  SpO2: 98 % on RA    A/P:  Remdesivir 200 mg IVPB once followed by 100 mg IVPB daily x 4 days.   Bertil Brickey D 12/30/2019 6:39 AM

## 2019-12-30 NOTE — Progress Notes (Signed)
Patient used Facilities manager with RN.

## 2019-12-30 NOTE — ED Notes (Signed)
Pt has chest pain with n/v   Pt is covid positive.  Pt is [redacted] weeks pregnant.  No abd pain or vag bleeding.  Pt reports fetal movement.  Pt is alert.  md at bedside  nsr on monitor.

## 2019-12-30 NOTE — ED Notes (Signed)
Blood work Physicist, medical by [employer], Charity fundraiser. FHT collected by this RN and Jaclynn Guarneri, Charity fundraiser. Pt provided with meal tray. Repositioned in bed to eat at this time. Pt denies further needs. Call bell within reach of patient at this time.

## 2019-12-30 NOTE — Progress Notes (Signed)
Eunice Blase CNM notified of low urine output that was dark amber color. Order given to administer LR fluid bolus followed by maintenance LR of 125 mL/hr.

## 2019-12-30 NOTE — Progress Notes (Signed)
RN went to bedside with Allena Katz, MD and Eunice Blase, CNM to assess pt and collaborate on plan of care. Allena Katz, MD at bedside at 1250 and reports keeping the patient on oxygen to keep o2 saturations above 95%. Patient currently on 1.5 L nasal cannula and saturating 97%. Allena Katz, MD states she is okay with patient staying on 1.5 L nasal cannula. Allena Katz, MD and Eunice Blase, CNM aware that patient refuses Lovenox at this time. Patient educated on importance by RN and MD. RN has SCDs on patient's bilateral lower extremities. RN offered to give patient her scheduled vitamins and medications. Patient states she wants to attempt to eat food before taking any medications to avoid getting sick. All questions about care have been answered and patient informed of plan of care and verbalized understanding. No other needs expressed at this time.

## 2019-12-30 NOTE — ED Notes (Signed)
Pt placed on 2L O2 

## 2019-12-30 NOTE — H&P (Signed)
History and Physical    Chelsey Fernandez NWG:[id number] DOB: 18-Mar-1991 DOA: 12/30/2019  PCP: Margaretann Loveless, PA-C   Patient coming from: home  I have personally briefly reviewed patient's old medical records in [facility]  Chief Complaint: Chest pain, vomiting, COVID-19 positive  HPI: Chelsey Fernandez is a 29 y.o. female at [redacted] weeks gestation, COVID-19 positive who received a monoclonal antibodies on 12/27/2019 who presents to the emergency room after she was awakened with retrosternal chest pain.  Patient states that since receiving monoclonal antibodies 2 days prior she has had nausea and vomiting.  She is also had mild shortness of breath.  No cough or fever.  Denies abdominal pain or diarrhea.  ED Course: On arrival in the emergency room she was tachycardic and tachypneic with O2 sat in the mid 90s on room air however desatted to 88% with lying flat and with activity.  Initial blood work was unremarkable she had a CTA PE protocol that showed multifocal pulmonary infiltrates in keeping with atypical infection which can be seen with COVID-19 pneumonia.  The emergency room provider spoke with OB/GYN who requested admission to the medical service on the medical floor.  Did not think patient needed to be on the mother-baby or OB/GYN floor .hospitalist consulted for admission EKG as reviewed by me : Normal sinus rhythm no acute ST-T wave changes  Review of Systems: As per HPI otherwise all other systems on review of systems negative.    Past Medical History:  Diagnosis Date  . Asthma    Exercise induce    Past Surgical History:  Procedure Laterality Date  . WISDOM TOOTH EXTRACTION       reports that she has never smoked. She has never used smokeless tobacco. She reports that she does not drink alcohol and does not use drugs.  No Known Allergies  History reviewed. No pertinent family history.    Prior to Admission medications   Medication Sig Start Date End Date Taking?  Authorizing Provider  docusate sodium (COLACE) 100 MG capsule Take 1 capsule (100 mg total) by mouth daily as needed for mild constipation. 11/13/15   Christeen Douglas, MD  Prenatal Vit-Fe Fumarate-FA (PRENATAL VITAMIN PO) Take by mouth daily.    [provider]    Physical Exam: Vitals:   12/30/19 0238 12/30/19 0300  BP: 107/63   Pulse: (!) 118 (!) 106  Resp: (!) 24 (!) 21  Temp: 97.8 F (36.6 C)   TempSrc: Oral   SpO2: 96% 98%     Vitals:   12/30/19 0238 12/30/19 0300  BP: 107/63   Pulse: (!) 118 (!) 106  Resp: (!) 24 (!) 21  Temp: 97.8 F (36.6 C)   TempSrc: Oral   SpO2: 96% 98%      Constitutional: Alert and oriented x 3 . Not in any apparent distress HEENT:      Head: Normocephalic and atraumatic.         Eyes: PERLA, EOMI, Conjunctivae are normal. Sclera is non-icteric.       Mouth/Throat: Mucous membranes are moist.       Neck: Supple with no signs of meningismus. Cardiovascular: Regular rate and rhythm. No murmurs, gallops, or rubs. 2+ symmetrical distal pulses are present . No JVD. No LE edema Respiratory: Respiratory effort slightly increased.coarse breath sounds with wheezes gastrointestinal:  Gravid uterus, nontender, and non distended with positive bowel sounds. No rebound or guarding. Genitourinary: No CVA tenderness. Musculoskeletal: Nontender with normal range of motion in all  extremities. No cyanosis, or erythema of extremities. Neurologic:  Face is symmetric. Moving all extremities. No gross focal neurologic deficits . Skin: Skin is warm, dry.  No rash or ulcers Psychiatric: Mood and affect are normal    Labs on Admission: I have personally reviewed following labs and imaging studies  CBC: Recent Labs  Lab 12/27/19 1418 12/30/19 0245  WBC 3.7* 7.2  HGB 11.9* 13.0  HCT 36.2 39.6  MCV 84.4 85.0  PLT 156 203   Basic Metabolic Panel: Recent Labs  Lab 12/27/19 1418 12/30/19 0245  NA 134* 136  K 3.7 4.0  CL 102 108  CO2 19* 15*    GLUCOSE 81 82  BUN <5* 7  CREATININE 0.49 0.67  CALCIUM 8.6* 9.4   GFR: Estimated Creatinine Clearance: 131.9 mL/min (by C-G formula based on SCr of 0.67 mg/dL). Liver Function Tests: No results for input(s): AST, ALT, ALKPHOS, BILITOT, PROT, ALBUMIN in the last 168 hours. No results for input(s): LIPASE, AMYLASE in the last 168 hours. No results for input(s): AMMONIA in the last 168 hours. Coagulation Profile: No results for input(s): INR, PROTIME in the last 168 hours. Cardiac Enzymes: No results for input(s): CKTOTAL, CKMB, CKMBINDEX, TROPONINI in the last 168 hours. BNP (last 3 results) No results for input(s): PROBNP in the last 8760 hours. HbA1C: No results for input(s): HGBA1C in the last 72 hours. CBG: No results for input(s): GLUCAP in the last 168 hours. Lipid Profile: No results for input(s): CHOL, HDL, LDLCALC, TRIG, CHOLHDL, LDLDIRECT in the last 72 hours. Thyroid Function Tests: No results for input(s): TSH, T4TOTAL, FREET4, T3FREE, THYROIDAB in the last 72 hours. Anemia Panel: No results for input(s): VITAMINB12, FOLATE, FERRITIN, TIBC, IRON, RETICCTPCT in the last 72 hours. Urine analysis:    Component Value Date/Time   COLORURINE YELLOW (A) 12/27/2019 1418   APPEARANCEUR CLOUDY (A) 12/27/2019 1418   LABSPEC 1.014 12/27/2019 1418   PHURINE 6.0 12/27/2019 1418   GLUCOSEU NEGATIVE 12/27/2019 1418   HGBUR NEGATIVE 12/27/2019 1418   BILIRUBINUR NEGATIVE 12/27/2019 1418   KETONESUR 80 (A) 12/27/2019 1418   PROTEINUR 30 (A) 12/27/2019 1418   NITRITE NEGATIVE 12/27/2019 1418   LEUKOCYTESUR TRACE (A) 12/27/2019 1418    Radiological Exams on Admission: CT Angio Chest PE W and/or Wo Contrast  Result Date: 12/30/2019 CLINICAL DATA:  Pregnant, COVID pneumonia, dyspnea EXAM: CT ANGIOGRAPHY CHEST WITH CONTRAST TECHNIQUE: Multidetector CT imaging of the chest was performed using the standard protocol during bolus administration of intravenous contrast. Multiplanar CT  image reconstructions and MIPs were obtained to evaluate the vascular anatomy. CONTRAST:  39mL OMNIPAQUE IOHEXOL 350 MG/ML SOLN COMPARISON:  None. FINDINGS: Cardiovascular: There is mild respiratory motion artifact, however, there is adequate opacification of the pulmonary arterial tree through the segmental level. There is no intraluminal filling defect identified to suggest acute pulmonary embolism. The central pulmonary arteries are of normal caliber. No significant coronary artery calcification. Cardiac size within normal limits. No pericardial effusion. Thoracic aorta is normal. Mediastinum/Nodes: No pathologic thoracic adenopathy. Small hiatal hernia. Lungs/Pleura: There are multifocal airspace infiltrates seen scattered asymmetrically throughout the lungs in keeping with acute infection or inflammation. The findings can be seen with, but are not prototypical of COVID 19 pneumonia. No pneumothorax or pleural effusion. Central airways are widely patent. Upper Abdomen: No acute abnormality. Musculoskeletal: No chest wall abnormality. No acute or significant osseous findings. Review of the MIP images confirms the above findings. IMPRESSION: Multifocal pulmonary infiltrates in keeping with atypical infection or  inflammation in the acute setting. The findings can be seen with, but are not prototypical of COVID 19 pneumonia. Electronically Signed   By: Helyn Numbers MD   On: 12/30/2019 04:07     Assessment/Plan 29 year old female at [redacted] weeks gestation, COVID-19 positive who received a monoclonal antibodies on 12/27/2019 presenting with retrosternal chest pain following 2 days of vomiting starting after receiving monoclonal antibodies    Chest pain -CTA chest ruled out PE.  Showed multifocal pneumonia    Pneumonia due to COVID-19 virus with hypoxia -Remdesivir, albuterol, antitussives, vitamins    Vomiting -Antiemetics  Third trimester pregnancy -OB/GYN consult -Case discussed with Dr. Tiburcio Pea who  prefers that patient be on the medical floor(as opposed to mother-baby/labor delivery) but should the need arise will transfer immediately to labor delivery    DVT prophylaxis: SCDs Code Status: full code  Family Communication:  none  Disposition Plan: Back to previous home environment Consults called: Dr. Tiburcio Pea OB/GYN Status:At the time of admission, it appears that the appropriate admission status for this patient is INPATIENT. This is judged to be reasonable and necessary in order to provide the required intensity of service to ensure the patient's safety given the presenting symptoms, physical exam findings, and initial radiographic and laboratory data in the context of their  Comorbid conditions.   Patient requires inpatient status due to high intensity of service, high risk for further deterioration and high frequency of surveillance required.   I certify that at the point of admission it is my clinical judgment that the patient will require inpatient hospital care spanning beyond 2 midnights     Andris Baumann MD [facility]     12/30/2019, 5:07 AM

## 2019-12-30 NOTE — ED Notes (Signed)
Report off to vanessa rn 

## 2019-12-30 NOTE — ED Provider Notes (Signed)
[facility] Emergency Department Provider Note  ____________________________________________   First MD Initiated Contact with Patient 12/30/19 0250     (approximate)  I have reviewed the triage vital signs and the nursing notes.   HISTORY  Chief Complaint Emesis and Shortness of Breath    HPI Chelsey Fernandez is a 29 y.o. female G2, P1 currently [redacted] weeks pregnant with known diagnosis of COVID-19 returns to the emergency department secondary to central chest discomfort and worsening dyspnea.  Patient was recently admitted on 12/27/2019 secondary to the same.  With minimal exertion patient's oxygen saturation 88%.  Patient states that symptoms worsened on Sunday which was day 9 of her illness.        Past Medical History:  Diagnosis Date  . Asthma    Exercise induce    Patient Active Problem List   Diagnosis Date Noted  . Pneumonia due to COVID-19 virus 12/30/2019  . Hypoxia 12/30/2019  . Labor and delivery indication for care or intervention 12/27/2019  . Labor and delivery, indication for care 11/10/2015    Past Surgical History:  Procedure Laterality Date  . WISDOM TOOTH EXTRACTION      Prior to Admission medications   Medication Sig Start Date End Date Taking? Authorizing Provider  docusate sodium (COLACE) 100 MG capsule Take 1 capsule (100 mg total) by mouth daily as needed for mild constipation. 11/13/15   Christeen Douglas, MD  Prenatal Vit-Fe Fumarate-FA (PRENATAL VITAMIN PO) Take by mouth daily.    [provider]    Allergies Patient has no known allergies.  History reviewed. No pertinent family history.  Social History Social History   Tobacco Use  . Smoking status: Never Smoker  . Smokeless tobacco: Never Used  Substance Use Topics  . Alcohol use: No  . Drug use: No    Review of Systems Constitutional: No fever/chills Eyes: No visual changes. ENT: No sore throat. Cardiovascular: Positive for chest pain and  dyspnea Respiratory: Denies shortness of breath. Gastrointestinal: No abdominal pain.  No nausea, no vomiting.  No diarrhea.  No constipation. Genitourinary: Negative for dysuria. Musculoskeletal: Negative for neck pain.  Negative for back pain. Integumentary: Negative for rash. Neurological: Negative for headaches, focal weakness or numbness.   ____________________________________________   PHYSICAL EXAM:  VITAL SIGNS: ED Triage Vitals [12/30/19 0238]  Enc Vitals Group     BP 107/63     Pulse Rate (!) 118     Resp (!) 24     Temp 97.8 F (36.6 C)     Temp Source Oral     SpO2 96 %     Weight      Height      Head Circumference      Peak Flow      Pain Score      Pain Loc      Pain Edu?      Excl. in GC?     Constitutional: Alert and oriented.  Eyes: Conjunctivae are normal.  Head: Atraumatic. Mouth/Throat: Patient is wearing a mask. Neck: No stridor.  No meningeal signs.   Cardiovascular: Normal rate, regular rhythm. Good peripheral circulation. Grossly normal heart sounds. Respiratory: Tachypnea, diffuse bilateral rhonchi. Gastrointestinal: Soft and nontender. No distention.  Musculoskeletal: No lower extremity tenderness nor edema. No gross deformities of extremities. Neurologic:  Normal speech and language. No gross focal neurologic deficits are appreciated.  Skin:  Skin is warm, dry and intact. Psychiatric: Mood and affect are normal. Speech and behavior are  normal.  ____________________________________________   LABS (all labs ordered are listed, but only abnormal results are displayed)  Labs Reviewed  BASIC METABOLIC PANEL - Abnormal; Notable for the following components:      Result Value   CO2 15 (*)    All other components within normal limits  CBC  TROPONIN I (HIGH SENSITIVITY)  TROPONIN I (HIGH SENSITIVITY)   ____________________________________________  EKG ED ECG REPORT I, Keota N Eleah Lahaie, the attending physician, personally viewed and  interpreted this ECG.   Date: 12/30/2019  EKG Time: 2:26 AM  Rate: 102  Rhythm: Sinus tachycardia  Axis: Normal  Intervals: Normal  ST&T Change: None   ____________________________________________  RADIOLOGY I,  Dewayne Shorter, personally viewed and evaluated these images (plain radiographs) as part of my medical decision making, as well as reviewing the written report by the radiologist.  ED MD interpretation:    Official radiology report(s): CT Angio Chest PE W and/or Wo Contrast  Result Date: 12/30/2019 CLINICAL DATA:  Pregnant, COVID pneumonia, dyspnea EXAM: CT ANGIOGRAPHY CHEST WITH CONTRAST TECHNIQUE: Multidetector CT imaging of the chest was performed using the standard protocol during bolus administration of intravenous contrast. Multiplanar CT image reconstructions and MIPs were obtained to evaluate the vascular anatomy. CONTRAST:  22mL OMNIPAQUE IOHEXOL 350 MG/ML SOLN COMPARISON:  None. FINDINGS: Cardiovascular: There is mild respiratory motion artifact, however, there is adequate opacification of the pulmonary arterial tree through the segmental level. There is no intraluminal filling defect identified to suggest acute pulmonary embolism. The central pulmonary arteries are of normal caliber. No significant coronary artery calcification. Cardiac size within normal limits. No pericardial effusion. Thoracic aorta is normal. Mediastinum/Nodes: No pathologic thoracic adenopathy. Small hiatal hernia. Lungs/Pleura: There are multifocal airspace infiltrates seen scattered asymmetrically throughout the lungs in keeping with acute infection or inflammation. The findings can be seen with, but are not prototypical of COVID 19 pneumonia. No pneumothorax or pleural effusion. Central airways are widely patent. Upper Abdomen: No acute abnormality. Musculoskeletal: No chest wall abnormality. No acute or significant osseous findings. Review of the MIP images confirms the above findings. IMPRESSION:  Multifocal pulmonary infiltrates in keeping with atypical infection or inflammation in the acute setting. The findings can be seen with, but are not prototypical of COVID 19 pneumonia. Electronically Signed   By: Helyn Numbers MD   On: 12/30/2019 04:07      Procedures   ____________________________________________   INITIAL IMPRESSION / MDM / ASSESSMENT AND PLAN / ED COURSE  As part of my medical decision making, I reviewed the following data within the electronic MEDICAL RECORD NUMBER   3-year-old female presenting with above-stated history and physical exam inserting for worsening COVID-19 infection with possible Covid pneumoniaVersus pulmonary emboli.  As such CT scan of the chest was performed which revealed multifocal infiltrates.  Patient discussed with Dr. Tiburcio Pea OB/GYN who recommended that the patient be admitted to the internal medicine staff with OB/GYN consultation.  As such patient discussed with Dr. Para March internal medicine/hospitalist will admit the patient for further evaluation and management  ____________________________________________  FINAL CLINICAL IMPRESSION(S) / ED DIAGNOSES  Final diagnoses:  Pneumonia due to COVID-19 virus     MEDICATIONS GIVEN DURING THIS VISIT:  Medications  dexamethasone (DECADRON) injection 10 mg (0 mg Intravenous Hold 12/30/19 0457)  iohexol (OMNIPAQUE) 350 MG/ML injection 75 mL (75 mLs Intravenous Contrast Given 12/30/19 0351)     ED Discharge Orders    None      *Please note:  Anntoinette Bernhart was evaluated  in Emergency Department on 12/30/2019 for the symptoms described in the history of present illness. She was evaluated in the context of the global COVID-19 pandemic, which necessitated consideration that the patient might be at risk for infection with the SARS-CoV-2 virus that causes COVID-19. Institutional protocols and algorithms that pertain to the evaluation of patients at risk for COVID-19 are in a state of rapid change based on  information released by regulatory bodies including the CDC and federal and state organizations. These policies and algorithms were followed during the patient's care in the ED.  Some ED evaluations and interventions may be delayed as a result of limited staffing during and after the pandemic.*  Note:  This document was prepared using Dragon voice recognition software and may include unintentional dictation errors.   Darci Current, MD 12/30/19 [phone]

## 2019-12-30 NOTE — Progress Notes (Signed)
Chelsey Fernandez is a 29 y.o. G2P1001 at [redacted]w[redacted]d by LMP admitted from the ED for treatment of  +COVID pneumonia. Her prenatal care has been with a lay midwife, as the patient planned on a home delivery. She has been admitted to labor and Delivery, but denies any regular contractions, vaginal bleeding or LOF. Her baby is moving well. Her COVID infection and the pneumonia is being managed by the  Hospitalist team. She is on O2 via nasal cannula. NSTs are ordered q shift. She is resting quietly in bed. Her medical Hx is notable for exercise induced asthma, high BMI.obstetrically, she has a hx of one SVD.  No prenatal records are available for her. prenatal labs were drawn several days ago when she visited the ED.    Subjective:  She denies any present cramping, Ample fetal movement. She has not had a vaginal exam during the pregnancy, and would like to know if she is dilated.  Objective: BP 118/84   Pulse 93   Temp 97.7 F (36.5 C) (Oral)   Resp 20   Ht 5\' 7"  (1.702 m)   Wt 108.9 kg   SpO2 97%   BMI 37.59 kg/m  No intake/output data recorded. No intake/output data recorded.  FHT: Reactive NST UC:   Occasional BH UCs SVE:   Dilation: 3 Effacement (%): 30 Station: -3, Ballotable Exam by:: [email or web address] CNM  Labs: Lab Results  Component Value Date   WBC 6.4 12/30/2019   HGB 12.4 12/30/2019   HCT 37.5 12/30/2019   MCV 83.7 12/30/2019   PLT 182 12/30/2019    Assessment / Plan: OBservation  at 37 weeks 6 days. +COVID pneumonia Fetal Wellbeing:  Category I  COVID will continue to be managed by the [facility] Hospitalist group NSTs q shift, Will monitor rFHTS continuously if she begins contracting. Discussed her previous plan for home delivery, and the possibilty that she may deliver at [facility] in light of her  COVID status Dr. [facility] updated on her care and status.     Jean Rosenthal 12/30/2019, 3:09 PM

## 2019-12-31 DIAGNOSIS — R079 Chest pain, unspecified: Secondary | ICD-10-CM | POA: Diagnosis not present

## 2019-12-31 DIAGNOSIS — U071 COVID-19: Secondary | ICD-10-CM | POA: Diagnosis not present

## 2019-12-31 DIAGNOSIS — J1282 Pneumonia due to coronavirus disease 2019: Secondary | ICD-10-CM | POA: Diagnosis not present

## 2019-12-31 LAB — COMPREHENSIVE METABOLIC PANEL
ALT: 58 U/L — ABNORMAL HIGH (ref 0–44)
AST: 61 U/L — ABNORMAL HIGH (ref 15–41)
Albumin: 2.6 g/dL — ABNORMAL LOW (ref 3.5–5.0)
Alkaline Phosphatase: 157 U/L — ABNORMAL HIGH (ref 38–126)
Anion gap: 11 (ref 5–15)
BUN: 7 mg/dL (ref 6–20)
CO2: 16 mmol/L — ABNORMAL LOW (ref 22–32)
Calcium: 8.5 mg/dL — ABNORMAL LOW (ref 8.9–10.3)
Chloride: 108 mmol/L (ref 98–111)
Creatinine, Ser: 0.7 mg/dL (ref 0.44–1.00)
GFR calc Af Amer: >60 mL/min (ref >60)
GFR calc non Af Amer: >60 mL/min (ref >60)
Glucose, Bld: 92 mg/dL (ref 70–99)
Potassium: 3.8 mmol/L (ref 3.5–5.1)
Sodium: 135 mmol/L (ref 135–145)
Total Bilirubin: 1.5 mg/dL — ABNORMAL HIGH (ref 0.3–1.2)
Total Protein: 6.6 g/dL (ref 6.5–8.1)

## 2019-12-31 LAB — CBC WITH DIFFERENTIAL/PLATELET
Abs Immature Granulocytes: 0.1 10*3/uL — ABNORMAL HIGH (ref 0.00–0.07)
Basophils Absolute: 0 10*3/uL (ref 0.0–0.1)
Basophils Relative: 0 %
Eosinophils Absolute: 0.1 10*3/uL (ref 0.0–0.5)
Eosinophils Relative: 1 %
HCT: 33.5 % — ABNORMAL LOW (ref 36.0–46.0)
Hemoglobin: 11.4 g/dL — ABNORMAL LOW (ref 12.0–15.0)
Immature Granulocytes: 2 %
Lymphocytes Relative: 20 %
Lymphs Abs: 1.3 10*3/uL (ref 0.7–4.0)
MCH: 28.2 pg (ref 26.0–34.0)
MCHC: 34 g/dL (ref 30.0–36.0)
MCV: 82.9 fL (ref 80.0–100.0)
Monocytes Absolute: 0.7 10*3/uL (ref 0.1–1.0)
Monocytes Relative: 11 %
Neutro Abs: 4.5 10*3/uL (ref 1.7–7.7)
Neutrophils Relative %: 66 %
Platelets: 173 10*3/uL (ref 150–400)
RBC: 4.04 MIL/uL (ref 3.87–5.11)
RDW: 15.4 % (ref 11.5–15.5)
WBC: 6.7 10*3/uL (ref 4.0–10.5)
nRBC: 0 % (ref 0.0–0.2)

## 2019-12-31 LAB — C-REACTIVE PROTEIN
CRP: 1.3 mg/dL — ABNORMAL HIGH (ref <1.0)
CRP: 0.7 mg/dL (ref <1.0)

## 2019-12-31 LAB — FIBRIN DERIVATIVES D-DIMER (ARMC ONLY): Fibrin derivatives D-dimer (ARMC): 1822.85 ng/mL (FEU) — ABNORMAL HIGH (ref 0.00–499.00)

## 2019-12-31 MED ORDER — MENTHOL 3 MG MT LOZG
1.0000 | LOZENGE | OROMUCOSAL | Status: DC | PRN
  Administered 2019-12-31: 3 mg via ORAL
  Filled 2019-12-31 (×2): qty 9

## 2019-12-31 NOTE — Progress Notes (Addendum)
  Progress Note   29 y.o. G2P1001 @ [redacted]w[redacted]d , admitted for pregnancy, Covid positive, multifocal pneumonia  Subjective:  Complaint of sore throat from O2 and is requesting medication. She has a dry cough, backache and is otherwise doing well.   Objective:  BP 116/76 (BP Location: Right Arm)   Pulse 86   Temp 97.7 F (36.5 C) (Oral)   Resp 20   Ht 5\' 7"  (1.702 m)   Wt 108.9 kg   SpO2 97%   BMI 37.59 kg/m  Abd: gravid, ND, FHT present, mild tenderness on exam Extr: trace edema  NST/EFM: FHR: 130 bpm, variability: moderate,  accelerations:  Present,  decelerations:  Absent Toco: irritability noted Labs: I have reviewed the patient's lab results.   Assessment & Plan:  G2P1001 @ [redacted]w[redacted]d, admitted for third trimester pregnancy, Covid Positive under care of Hospitalist Dr [redacted]w[redacted]d  1. Pain management: none. 2. FWB: FHT category I 3. ID: GBS negative 4. Plan of Care: continue with current orders per Hospitalist 5. Sore throat: cepacol lozenges prn  All discussed with patient, see orders   Allena Katz, CNM [facility] 12/31/2019  9:31 AM

## 2019-12-31 NOTE — Progress Notes (Signed)
PROGRESS NOTE    Chelsey Fernandez  MEB:[id number] DOB: Nov 13, 1990 DOA: 12/30/2019 PCP: Margaretann Loveless, PA-C   Brief Narrative:  Chelsey Fernandez is a 29 y.o. female at [redacted] weeks gestation, COVID-19 positive who received a monoclonal antibodies on 12/27/2019 who presents to the emergency room after she was awakened with retrosternal chest pain.  Patient states that since receiving monoclonal antibodies 2 days prior she has had nausea and vomiting.  She is also had mild shortness of breath.  No cough or fever.  Denies abdominal pain or diarrhea.  ED Course: On arrival in the emergency room she was tachycardic and tachypneic with O2 sat in the mid 90s on room air however desatted to 88% with lying flat and with activity.  Initial blood work was unremarkable she had a CTA PE protocol that showed multifocal pulmonary infiltrates in keeping with atypical infection which can be seen with COVID-19 pneumonia.  The emergency room provider spoke with OB/GYN who requested admission to the medical service on the medical floor.  Did not think patient needed to be on the mother-baby or OB/GYN floor .hospitalist consulted for admission   EKG: as reviewed by me : Normal sinus rhythm no acute ST-T wave changes   Assessment & Plan:  Principal Problem:   Chest pain Active Problems:   Pneumonia due to COVID-19 virus   Hypoxia   Vomiting   Third trimester pregnancy Chest pain -CTA chest ruled out PE.  Showed multifocal pneumonia --Cont Remdesivir vit,zinc,albuterol and vitamins.  9/23 -Patient's chest pain is resolved attributed to pain patient's Covid pneumonia.  -Patient's cough is improved.  Patient's oxygenation is stable at 95 to 97% on room air.    Pneumonia due to COVID-19 virus with hypoxia -Remdesivir, albuterol, antitussives, vitamins. -pt is not hypoxic is started on 1L [place] oxygen for comfort.  -Discussed with patient in detail about treatment modalities for Covid 19 infection. Discussed with  patient that remdesivir is currently indicated for her COVID-19 infection pneumonia.  Also discussed with patient side effects of remdesivir and that it is her choice to decide to take the medication.  Patient states that she would like to talk to her family and decide on further plan for treatment.  Discussed with patient that dexamethasone is currently not indicated her oxygen saturations is within normal limits.   Vomiting -Antiemetics Prn only.  Third trimester pregnancy -OB/GYN consult -Case discussed with Dr. Tiburcio Pea who prefers that patient be on the medical floor(as opposed to [facility] delivery) but should the need arise will transfer immediately to labor delivery.   DVT prophylaxis: Pt declined. Code Status: Full Code Family Communication: None at bedside Disposition Plan: Home  Status is: Inpatient  Remains inpatient appropriate because:Inpatient level of care appropriate due to severity of illness  Dispo: The patient is from: Home              Anticipated d/c is to: Home              Anticipated d/c date is: > 3 days              Patient currently is medically stable to d/c. Consultants:   None  Procedures: None  Antimicrobials:  Anti-infectives (From admission, onward)   Start     Dose/Rate Route Frequency Ordered Stop   12/31/19 1000  remdesivir 100 mg in sodium chloride 0.9 % 100 mL IVPB       "Followed by" Linked Group Details   100 mg 200 mL/hr over  30 Minutes Intravenous Daily 12/30/19 0636 01/04/20 0959   12/30/19 0645  remdesivir 200 mg in sodium chloride 0.9% 250 mL IVPB       "Followed by" Linked Group Details   200 mg 580 mL/hr over 30 Minutes Intravenous Once 12/30/19 0636 12/30/19 0917     Subjective: Pt seen today for Covid-19 infection pneumonia.  Pt is resting in bed on 1.5 L and sating at 97%. 9/23 Seen today in morning rounds and evening for discussion on treatment for her Covid. As outlined above discussed with patient about  therapy options as far as remdesivir and steroid therapy.  Is clinically not requiring any oxygen and is oxygenating at 95% pulse oximetry on room air.  Labs are stable vitals are stable.  NSTs are within normal limits per nurse.  Patient is currently on LR at 125 1 offered to discontinue IV fluids for prevention of any pulmonary vascular congestion secondary to Covid pneumonia high risk of developing ARDS secondary to Covid pneumonia, patient states that her urine is dark and that she would like me to continue the IV fluid at the current rate.  Explained to patient that due to development of arts is recommended to keep Covid pneumonia and Covid infection patient's on the dry side.  Patient verbalized understanding nurse at bedside.  Objective: Vitals:   12/31/19 0904 12/31/19 1100 12/31/19 1329 12/31/19 1552  BP:    116/74  Pulse: 86  90 (!) 106  Resp: 20  20 18   Temp:  97.9 F (36.6 C)    TempSrc:  Oral    SpO2: 97%  96% 96%  Weight:      Height:        Intake/Output Summary (Last 24 hours) at 12/31/2019 1825 Last data filed at 12/31/2019 1553 Gross per 24 hour  Intake --  Output 2575 ml  Net -2575 ml   Filed Weights   12/30/19 1118  Weight: 108.9 kg    Examination: Blood pressure 116/74, pulse (!) 106, temperature 97.9 F (36.6 C), temperature source Oral, resp. rate 18, height 5\' 7"  (1.702 m), weight 108.9 kg, SpO2 96 %, unknown if currently breastfeeding. General exam: Appears calm and comfortable  Respiratory system: Clear to auscultation. Respiratory effort normal. Cardiovascular system: S1 & S2 heard, RRR. No JVD, murmurs, rubs, gallops or clicks. No pedal edema. Gastrointestinal system: Abdomen is nondistended, soft and nontender. No organomegaly or masses felt. Normal bowel sounds heard. Central nervous system: Alert and oriented. No focal neurological deficits. Extremities: Symmetric 5 x 5 power. Skin: No rashes, lesions or ulcers Psychiatry: Judgement and insight  appear normal. Mood & affect appropriate.   Data Reviewed: I have personally reviewed following labs and imaging studies. I/O last 3 completed shifts: In: 520.8 [I.V.:13.1; IV Piggyback:507.7] Out: 1025 [Urine:1025] Total I/O In: -  Out: 1750 [Urine:1750] Lab Results  Component Value Date   CREATININE 0.70 12/31/2019   CREATININE 0.67 12/30/2019   CREATININE 0.67 12/30/2019   CBC: Recent Labs  Lab 12/27/19 1418 12/30/19 0245 12/30/19 1011 12/31/19 0657  WBC 3.7* 7.2 6.4 6.7  NEUTROABS  --   --  4.6 4.5  HGB 11.9* 13.0 12.4 11.4*  HCT 36.2 39.6 37.5 33.5*  MCV 84.4 85.0 83.7 82.9  PLT 156 203 182 173   Basic Metabolic Panel: Recent Labs  Lab 12/27/19 1418 12/30/19 0245 12/30/19 1011 12/31/19 0657  NA 134* 136 134* 135  K 3.7 4.0 4.3 3.8  CL 102 108 105 108  CO2 19*  15* 15* 16*  GLUCOSE 81 82 78 92  BUN <5* 7 7 7   CREATININE 0.49 0.67 0.67 0.70  CALCIUM 8.6* 9.4 9.5 8.5*   GFR: Estimated Creatinine Clearance: 131.9 mL/min (by C-G formula based on SCr of 0.7 mg/dL).   Liver Function Tests: Recent Labs  Lab 12/30/19 1011 12/31/19 0657  AST 28 61*  ALT 21 58*  ALKPHOS 158* 157*  BILITOT 1.8* 1.5*  PROT 7.2 6.6  ALBUMIN 2.9* 2.6*   Recent Results (from the past 240 hour(s))  SARS Coronavirus 2 by RT PCR (hospital order, performed in [facility] hospital lab) Nasopharyngeal Nasopharyngeal Swab     Status: Abnormal   Collection Time: 12/27/19  2:52 PM   Specimen: Nasopharyngeal Swab  Result Value Ref Range Status   SARS Coronavirus 2 POSITIVE (A) NEGATIVE Final    Comment: RESULT CALLED TO, READ BACK BY AND VERIFIED WITH: SARAH APEL ON 12/27/19 AT 1735 QSD (NOTE) SARS-CoV-2 target nucleic acids are DETECTED  SARS-CoV-2 RNA is generally detectable in upper respiratory specimens  during the acute phase of infection.  Positive results are indicative  of the presence of the identified virus, but do not rule out bacterial infection or co-infection with  other pathogens not detected by the test.  Clinical correlation with patient history and  other diagnostic information is necessary to determine patient infection status.  The expected result is negative.  Fact Sheet for Patients:   12/29/19   Fact Sheet for Healthcare Providers:   BoilerBrush.com.cy    This test is not yet approved or cleared by the https://pope.com/ FDA and  has been authorized for detection and/or diagnosis of SARS-CoV-2 by FDA under an Emergency Use Authorization (EUA).  This EUA will remain in effect (meaning this tes t can be used) for the duration of  the COVID-19 declaration under Section 564(b)(1) of the Act, 21 U.S.C. section 360-bbb-3(b)(1), unless the authorization is terminated or revoked sooner.  Performed at [facility], [address] [place], [place]   Chlamydia/NGC rt PCR [facility] only)     Status: None   Collection Time: 12/27/19  7:40 PM   Specimen: Cervical/Vaginal swab; Genital  Result Value Ref Range Status   Specimen source GC/Chlam ENDOCERVICAL  Final   Chlamydia Tr NOT DETECTED NOT DETECTED Final   N gonorrhoeae NOT DETECTED NOT DETECTED Final    Comment: (NOTE) This CT/NG assay has not been evaluated in patients with a history of  hysterectomy. Performed at [facility], [address] [place], [place]   Group B strep by PCR     Status: None   Collection Time: 12/27/19  7:40 PM   Specimen: Vaginal/Rectal; Genital  Result Value Ref Range Status   Group B strep by PCR NEGATIVE NEGATIVE Final    Comment: (NOTE) Intrapartum testing with Xpert GBS assay should be used as an adjunct to other methods available and not used to replace antepartum testing (at 35-[redacted] weeks gestation). Performed at [facility], [address] [place], [place]      Radiology Studies: CT Angio Chest PE W and/or Wo Contrast  Result Date:  12/30/2019 CLINICAL DATA:  Pregnant, COVID pneumonia, dyspnea EXAM: CT ANGIOGRAPHY CHEST WITH CONTRAST TECHNIQUE: Multidetector CT imaging of the chest was performed using the standard protocol during bolus administration of intravenous contrast. Multiplanar CT image reconstructions and MIPs were obtained to evaluate the vascular anatomy. CONTRAST:  45mL OMNIPAQUE IOHEXOL 350 MG/ML SOLN COMPARISON:  None.  FINDINGS: Cardiovascular: There is mild respiratory motion artifact, however, there is adequate opacification of the pulmonary arterial tree through the segmental level. There is no intraluminal filling defect identified to suggest acute pulmonary embolism. The central pulmonary arteries are of normal caliber. No significant coronary artery calcification. Cardiac size within normal limits. No pericardial effusion. Thoracic aorta is normal. Mediastinum/Nodes: No pathologic thoracic adenopathy. Small hiatal hernia. Lungs/Pleura: There are multifocal airspace infiltrates seen scattered asymmetrically throughout the lungs in keeping with acute infection or inflammation. The findings can be seen with, but are not prototypical of COVID 19 pneumonia. No pneumothorax or pleural effusion. Central airways are widely patent. Upper Abdomen: No acute abnormality. Musculoskeletal: No chest wall abnormality. No acute or significant osseous findings. Review of the MIP images confirms the above findings. IMPRESSION: Multifocal pulmonary infiltrates in keeping with atypical infection or inflammation in the acute setting. The findings can be seen with, but are not prototypical of COVID 19 pneumonia. Electronically Signed   By: Helyn NumbersAshesh  Parikh MD   On: 12/30/2019 04:07   Scheduled Meds: . albuterol  2 puff Inhalation Q6H  . vitamin C  500 mg Oral Daily  . dexamethasone (DECADRON) injection  10 mg Intravenous Once  . dexamethasone (DECADRON) injection  6 mg Intravenous Q24H  . enoxaparin (LOVENOX) injection  40 mg Subcutaneous  Q24H  . multivitamin with minerals  1 tablet Oral Daily  . zinc sulfate  220 mg Oral Daily   Continuous Infusions: . lactated ringers 125 mL/hr at 12/31/19 1404  . remdesivir 100 mg in NS 100 mL       LOS: 1 day   Gertha CalkinEkta V Roderic Lammert, MD [facility] Pager [phone] If 7PM-7AM, please contact night-coverage [email or web address] Password [email or web address] 12/31/2019, 6:25 PM

## 2020-01-01 DIAGNOSIS — R0902 Hypoxemia: Secondary | ICD-10-CM

## 2020-01-01 DIAGNOSIS — U071 COVID-19: Secondary | ICD-10-CM | POA: Diagnosis not present

## 2020-01-01 DIAGNOSIS — R079 Chest pain, unspecified: Secondary | ICD-10-CM | POA: Diagnosis not present

## 2020-01-01 DIAGNOSIS — J1282 Pneumonia due to coronavirus disease 2019: Secondary | ICD-10-CM | POA: Diagnosis not present

## 2020-01-01 LAB — COMPREHENSIVE METABOLIC PANEL
ALT: 203 U/L — ABNORMAL HIGH (ref 0–44)
AST: 174 U/L — ABNORMAL HIGH (ref 15–41)
Albumin: 2.3 g/dL — ABNORMAL LOW (ref 3.5–5.0)
Alkaline Phosphatase: 139 U/L — ABNORMAL HIGH (ref 38–126)
Anion gap: 9 (ref 5–15)
BUN: 6 mg/dL (ref 6–20)
CO2: 20 mmol/L — ABNORMAL LOW (ref 22–32)
Calcium: 8.3 mg/dL — ABNORMAL LOW (ref 8.9–10.3)
Chloride: 109 mmol/L (ref 98–111)
Creatinine, Ser: 0.58 mg/dL (ref 0.44–1.00)
GFR calc Af Amer: >60 mL/min (ref >60)
GFR calc non Af Amer: >60 mL/min (ref >60)
Glucose, Bld: 80 mg/dL (ref 70–99)
Potassium: 3.4 mmol/L — ABNORMAL LOW (ref 3.5–5.1)
Sodium: 138 mmol/L (ref 135–145)
Total Bilirubin: 1 mg/dL (ref 0.3–1.2)
Total Protein: 5.7 g/dL — ABNORMAL LOW (ref 6.5–8.1)

## 2020-01-01 LAB — CBC WITH DIFFERENTIAL/PLATELET
Abs Immature Granulocytes: 0.08 10*3/uL — ABNORMAL HIGH (ref 0.00–0.07)
Basophils Absolute: 0 10*3/uL (ref 0.0–0.1)
Basophils Relative: 0 %
Eosinophils Absolute: 0.1 10*3/uL (ref 0.0–0.5)
Eosinophils Relative: 1 %
HCT: 30.6 % — ABNORMAL LOW (ref 36.0–46.0)
Hemoglobin: 10.3 g/dL — ABNORMAL LOW (ref 12.0–15.0)
Immature Granulocytes: 1 %
Lymphocytes Relative: 18 %
Lymphs Abs: 1.1 10*3/uL (ref 0.7–4.0)
MCH: 28.1 pg (ref 26.0–34.0)
MCHC: 33.7 g/dL (ref 30.0–36.0)
MCV: 83.6 fL (ref 80.0–100.0)
Monocytes Absolute: 0.6 10*3/uL (ref 0.1–1.0)
Monocytes Relative: 9 %
Neutro Abs: 4.4 10*3/uL (ref 1.7–7.7)
Neutrophils Relative %: 71 %
Platelets: 134 10*3/uL — ABNORMAL LOW (ref 150–400)
RBC: 3.66 MIL/uL — ABNORMAL LOW (ref 3.87–5.11)
RDW: 15.3 % (ref 11.5–15.5)
WBC: 6.3 10*3/uL (ref 4.0–10.5)
nRBC: 0 % (ref 0.0–0.2)

## 2020-01-01 LAB — FIBRIN DERIVATIVES D-DIMER (ARMC ONLY): Fibrin derivatives D-dimer (ARMC): 1623.59 ng/mL (FEU) — ABNORMAL HIGH (ref 0.00–499.00)

## 2020-01-01 LAB — C-REACTIVE PROTEIN: CRP: 0.9 mg/dL (ref <1.0)

## 2020-01-01 MED ORDER — ALBUTEROL SULFATE HFA 108 (90 BASE) MCG/ACT IN AERS: 2.0000 | INHALATION_SPRAY | Freq: Four times a day (QID) | RESPIRATORY_TRACT | 0 refills | Status: DC

## 2020-01-01 MED ORDER — ZINC SULFATE 220 (50 ZN) MG PO CAPS: 220.0000 mg | ORAL_CAPSULE | Freq: Every day | ORAL | 0 refills | Status: DC

## 2020-01-01 NOTE — OB Triage Note (Signed)
Pt discharged home. RN reviewed labor precautions with patient and instructed her when to return for evaluation. Pt verbalized understanding of discharge instructions and  discharged home in stable condition via wheelchair.

## 2020-01-01 NOTE — Progress Notes (Signed)
Pulse ox provided to RN to provide to patient. Per RN, no oxygen needs at this time. No other needs identified. Please re consult TOC if additional needs arise.  Alfonso Ramus, [place] [phone]

## 2020-01-01 NOTE — Discharge Instructions (Signed)
10 Things You Can Do to Manage Your COVID-19 Symptoms at Home If you have possible or confirmed COVID-19: 1. Stay home from work and school. And stay away from other public places. If you must go out, avoid using any kind of public transportation, ridesharing, or taxis. 2. Monitor your symptoms carefully. If your symptoms get worse, call your healthcare provider immediately. 3. Get rest and stay hydrated. 4. If you have a medical appointment, call the healthcare provider ahead of time and tell them that you have or may have COVID-19. 5. For medical emergencies, call 911 and notify the dispatch personnel that you have or may have COVID-19. 6. Cover your cough and sneezes with a tissue or use the inside of your elbow. 7. Wash your hands often with soap and water for at least 20 seconds or clean your hands with an alcohol-based hand sanitizer that contains at least 60% alcohol. 8. As much as possible, stay in a specific room and away from other people in your home. Also, you should use a separate bathroom, if available. If you need to be around other people in or outside of the home, wear a mask. 9. Avoid sharing personal items with other people in your household, like dishes, towels, and bedding. 10. Clean all surfaces that are touched often, like counters, tabletops, and doorknobs. Use household cleaning sprays or wipes according to the label instructions. [email or web address] 10/08/2018 This information is not intended to replace advice given to you by your health care provider. Make sure you discuss any questions you have with your health care provider. Document Revised: 03/12/2019 Document Reviewed: 03/12/2019 Elsevier Patient Education  [place].   COVID-19 Frequently Asked Questions COVID-19 (coronavirus disease) is an infection that is caused by a large family of viruses. Some viruses cause illness in people and others cause illness in animals like camels, cats, and bats. In some  cases, the viruses that cause illness in animals can spread to humans. Where did the coronavirus come from? In December 2019, [place] told the [facility]) of several cases of lung disease (human respiratory illness). These cases were linked to an open seafood and livestock market in the city of [place]. The link to the seafood and livestock market suggests that the virus may have spread from animals to humans. However, since that first outbreak in December, the virus has also been shown to spread from person to person. What is the name of the disease and the virus? Disease name Early on, this disease was called novel coronavirus. This is because scientists determined that the disease was caused by a new (novel) respiratory virus. The World Health Organization [facility] has now named the disease COVID-19, or coronavirus disease. Virus name The virus that causes the disease is called severe acute respiratory syndrome coronavirus 2 (SARS-CoV-2). More information on disease and virus naming World Health Organization [facility]): www.who.int/emergencies/diseases/novel-coronavirus-2019/technical-guidance/naming-the-coronavirus-disease-(covid-2019)-and-the-virus-that-causes-it Who is at risk for complications from coronavirus disease? Some people may be at higher risk for complications from coronavirus disease. This includes older adults and people who have chronic diseases, such as heart disease, diabetes, and lung disease. If you are at higher risk for complications, take these extra precautions:  Stay home as much as possible.  Avoid social gatherings and travel.  Avoid close contact with others. Stay at least 6 ft (2 m) away from others, if possible.  Wash your hands often with soap and water for at least 20 seconds.  Avoid touching your face, mouth, nose, or eyes.  Keep supplies on hand at home, such as food, medicine, and cleaning supplies.  If you must go out in public, wear a cloth  face covering or face mask. Make sure your mask covers your nose and mouth. How does coronavirus disease spread? The virus that causes coronavirus disease spreads easily from person to person (is contagious). You may catch the virus by:  Breathing in droplets from an infected person. Droplets can be spread by a person breathing, speaking, singing, coughing, or sneezing.  Touching something, like a table or a doorknob, that was exposed to the virus (contaminated) and then touching your mouth, nose, or eyes. Can I get the virus from touching surfaces or objects? There is still a lot that we do not know about the virus that causes coronavirus disease. Scientists are basing a lot of information on what they know about similar viruses, such as:  Viruses cannot generally survive on surfaces for long. They need a human body (host) to survive.  It is more likely that the virus is spread by close contact with people who are sick (direct contact), such as through: ? Shaking hands or hugging. ? Breathing in respiratory droplets that travel through the air. Droplets can be spread by a person breathing, speaking, singing, coughing, or sneezing.  It is less likely that the virus is spread when a person touches a surface or object that has the virus on it (indirect contact). The virus may be able to enter the body if the person touches a surface or object and then touches his or her face, eyes, nose, or mouth. Can a person spread the virus without having symptoms of the disease? It may be possible for the virus to spread before a person has symptoms of the disease, but this is most likely not the main way the virus is spreading. It is more likely for the virus to spread by being in close contact with people who are sick and breathing in the respiratory droplets spread by a person breathing, speaking, singing, coughing, or sneezing. What are the symptoms of coronavirus disease? Symptoms vary from person to  person and can range from mild to severe. Symptoms may include:  Fever or chills.  Cough.  Difficulty breathing or feeling short of breath.  Headaches, body aches, or muscle aches.  Runny or stuffy (congested) nose.  Sore throat.  New loss of taste or smell.  Nausea, vomiting, or diarrhea. These symptoms can appear anywhere from 2 to 14 days after you have been exposed to the virus. Some people may not have any symptoms. If you develop symptoms, call your health care provider. People with severe symptoms may need hospital care. Should I be tested for this virus? Your health care provider will decide whether to test you based on your symptoms, history of exposure, and your risk factors. How does a health care provider test for this virus? Health care providers will collect samples to send for testing. Samples may include:  Taking a swab of fluid from the back of your nose and throat, your nose, or your throat.  Taking fluid from the lungs by having you cough up mucus (sputum) into a sterile cup.  Taking a blood sample. Is there a treatment or vaccine for this virus? Currently, there is no vaccine to prevent coronavirus disease. Also, there are no medicines like antibiotics or antivirals to treat the virus. A person who becomes sick is given supportive care, which means rest and fluids. A person may also  relieve his or her symptoms by using over-the-counter medicines that treat sneezing, coughing, and runny nose. These are the same medicines that a person takes for the common cold. If you develop symptoms, call your health care provider. People with severe symptoms may need hospital care. What can I do to protect myself and my family from this virus?     You can protect yourself and your family by taking the same actions that you would take to prevent the spread of other viruses. Take the following actions:  Wash your hands often with soap and water for at least 20 seconds. If soap  and water are not available, use alcohol-based hand sanitizer.  Avoid touching your face, mouth, nose, or eyes.  Cough or sneeze into a tissue, sleeve, or elbow. Do not cough or sneeze into your hand or the air. ? If you cough or sneeze into a tissue, throw it away immediately and wash your hands.  Disinfect objects and surfaces that you frequently touch every day.  Stay away from people who are sick.  Avoid going out in public, follow guidance from your state and local health authorities.  Avoid crowded indoor spaces. Stay at least 6 ft (2 m) away from others.  If you must go out in public, wear a cloth face covering or face mask. Make sure your mask covers your nose and mouth.  Stay home if you are sick, except to get medical care. Call your health care provider before you get medical care. Your health care provider will tell you how long to stay home.  Make sure your vaccines are up to date. Ask your health care provider what vaccines you need. What should I do if I need to travel? Follow travel recommendations from your local health authority, the CDC, and WHO. Travel information and advice  Centers for Disease Control and Prevention (CDC): www.cdc.gov/coronavirus/2019-ncov/travelers/index.html  World Health Organization (WHO): www.who.int/emergencies/diseases/novel-coronavirus-2019/travel-advice Know the risks and take action to protect your health  You are at higher risk of getting coronavirus disease if you are traveling to areas with an outbreak or if you are exposed to travelers from areas with an outbreak.  Wash your hands often and practice good hygiene to lower the risk of catching or spreading the virus. What should I do if I am sick? General instructions to stop the spread of infection  Wash your hands often with soap and water for at least 20 seconds. If soap and water are not available, use alcohol-based hand sanitizer.  Cough or sneeze into a tissue, sleeve, or  elbow. Do not cough or sneeze into your hand or the air.  If you cough or sneeze into a tissue, throw it away immediately and wash your hands.  Stay home unless you must get medical care. Call your health care provider or local health authority before you get medical care.  Avoid public areas. Do not take public transportation, if possible.  If you can, wear a mask if you must go out of the house or if you are in close contact with someone who is not sick. Make sure your mask covers your nose and mouth. Keep your home clean  Disinfect objects and surfaces that are frequently touched every day. This may include: ? Counters and tables. ? Doorknobs and light switches. ? Sinks and faucets. ? Electronics such as phones, remote controls, keyboards, computers, and tablets.  Wash dishes in hot, soapy water or use a dishwasher. Air-dry your dishes.  Wash laundry in   hot water. °Prevent infecting other household members °· Let healthy household members care for children and pets, if possible. If you have to care for children or pets, wash your hands often and wear a mask. °· Sleep in a different bedroom or bed, if possible. °· Do not share personal items, such as razors, toothbrushes, deodorant, combs, brushes, towels, and washcloths. °Where to find more information °Centers for Disease Control and Prevention (CDC) °· Information and news updates: www.cdc.gov/coronavirus/2019-ncov °World Health Organization (WHO) °· Information and news updates: www.who.int/emergencies/diseases/novel-coronavirus-2019 °· Coronavirus health topic: www.who.int/health-topics/coronavirus °· Questions and answers on COVID-19: www.who.int/news-room/q-a-detail/q-a-coronaviruses °· Global tracker: who.sprinklr.com °American Academy of Pediatrics (AAP) °· Information for families: www.healthychildren.org/English/health-issues/conditions/chest-lungs/Pages/2019-Novel-Coronavirus.aspx °The coronavirus situation is changing rapidly. Check  your local health authority website or the CDC and WHO websites for updates and news. °When should I contact a health care provider? °· Contact your health care provider if you have symptoms of an infection, such as fever or cough, and you: °? Have been near anyone who is known to have coronavirus disease. °? Have come into contact with a person who is suspected to have coronavirus disease. °? Have traveled to an area where there is an outbreak of COVID-19. °When should I get emergency medical care? °· Get help right away by calling your local emergency services (911 in the [place] if you have: °? Trouble breathing. °? Pain or pressure in your chest. °? Confusion. °? Blue-tinged lips and fingernails. °? Difficulty waking from sleep. °? Symptoms that get worse. °Let the emergency medical personnel know if you think you have coronavirus disease. °Summary °· A new respiratory virus is spreading from person to person and causing COVID-19 (coronavirus disease). °· The virus that causes COVID-19 appears to spread easily. It spreads from one person to another through droplets from breathing, speaking, singing, coughing, or sneezing. °· Older adults and those with chronic diseases are at higher risk of disease. If you are at higher risk for complications, take extra precautions. °· There is currently no vaccine to prevent coronavirus disease. There are no medicines, such as antibiotics or antivirals, to treat the virus. °· You can protect yourself and your family by washing your hands often, avoiding touching your face, and covering your coughs and sneezes. °This information is not intended to replace advice given to you by your health care provider. Make sure you discuss any questions you have with your health care provider. °Document Revised: 01/23/2019 Document Reviewed: 07/22/2018 °Elsevier Patient Education © 2020 Elsevier Inc. ° °COVID-19: Quarantine vs. Isolation °QUARANTINE keeps someone who was in close contact with  someone who has COVID-19 away from others. °If you had close contact with a person who has COVID-19 °· Stay home until 14 days after your last contact. °· Check your temperature twice a day and watch for symptoms of COVID-19. °· If possible, stay away from people who are at higher-risk for getting very sick from COVID-19. °ISOLATION keeps someone who is sick or tested positive for COVID-19 without symptoms away from others, even in their own home. °If you are sick and think or know you have COVID-19 °· Stay home until after °? At least 10 days since symptoms first appeared and °? At least 24 hours with no fever without fever-reducing medication and °? Symptoms have improved °If you tested positive for COVID-19 but do not have symptoms °· Stay home until after °? 10 days have passed since your positive test °If you live with others, stay in a specific "sick room"   or area and away from other people or animals, including pets. Use a separate bathroom, if available. [email or web address] 10/27/2018 This information is not intended to replace advice given to you by your health care provider. Make sure you discuss any questions you have with your health care provider. Document Revised: 03/12/2019 Document Reviewed: 03/12/2019 Elsevier Patient Education  2020 Elsevier Inc.  COVID-19: How to Protect Yourself and Others Know how it spreads  There is currently no vaccine to prevent coronavirus disease 2019 (COVID-19).  The best way to prevent illness is to avoid being exposed to this virus.  The virus is thought to spread mainly from person-to-person. ? Between people who are in close contact with one another (within about 6 feet). ? Through respiratory droplets produced when an infected person coughs, sneezes or talks. ? These droplets can land in the mouths or noses of people who are nearby or possibly be inhaled into the lungs. ? COVID-19 may be spread by people who are not showing symptoms. Everyone  should Clean your hands often  Wash your hands often with soap and water for at least 20 seconds especially after you have been in a public place, or after blowing your nose, coughing, or sneezing.  If soap and water are not readily available, use a hand sanitizer that contains at least 60% alcohol. Cover all surfaces of your hands and rub them together until they feel dry.  Avoid touching your eyes, nose, and mouth with unwashed hands. Avoid close contact  Limit contact with others as much as possible.  Avoid close contact with people who are sick.  Put distance between yourself and other people. ? Remember that some people without symptoms may be able to spread virus. ? This is especially important for people who are at higher risk of getting very [email or web address] Cover your mouth and nose with a mask when around others  You could spread COVID-19 to others even if you do not feel sick.  Everyone should wear a mask in public settings and when around people not living in their household, especially when social distancing is difficult to maintain. ? Masks should not be placed on young children under age 34, anyone who has trouble breathing, or is unconscious, incapacitated or otherwise unable to remove the mask without assistance.  The mask is meant to protect other people in case you are infected.  Do NOT use a facemask meant for a Research scientist (physical sciences).  Continue to keep about 6 feet between yourself and others. The mask is not a substitute for social distancing. Cover coughs and sneezes  Always cover your mouth and nose with a tissue when you cough or sneeze or use the inside of your elbow.  Throw used tissues in the trash.  Immediately wash your hands with soap and water for at least 20 seconds. If soap and water are not readily available, clean your hands with a hand sanitizer that contains at least 60%  alcohol. Clean and disinfect  Clean AND disinfect frequently touched surfaces daily. This includes tables, doorknobs, light switches, countertops, handles, desks, phones, keyboards, toilets, faucets, and sinks. [email or web address]  If surfaces are dirty, clean them: Use detergent or soap and water prior to disinfection.  Then, use a household disinfectant. You can see a list of EPA-registered household disinfectants here. [email or web address] 12/10/2018 This information is not intended to replace advice given to you by your health care provider. Make sure you discuss any questions you have with your health care provider. Document Revised: 12/18/2018  Document Reviewed: 10/16/2018 Elsevier Patient Education  2020 Elsevier Inc.   COVID-19 COVID-19 is a respiratory infection that is caused by a virus called severe acute respiratory syndrome coronavirus 2 (SARS-CoV-2). The disease is also known as coronavirus disease or novel coronavirus. In some people, the virus may not cause any symptoms. In others, it may cause a serious infection. The infection can get worse quickly and can lead to complications, such as:  Pneumonia, or infection of the lungs.  Acute respiratory distress syndrome or ARDS. This is a condition in which fluid build-up in the lungs prevents the lungs from filling with air and passing oxygen into the blood.  Acute respiratory failure. This is a condition in which there is not enough oxygen passing from the lungs to the body or when carbon dioxide is not passing from the lungs out of the body.  Sepsis or septic shock. This is a serious bodily reaction to an infection.  Blood clotting problems.  Secondary infections due to bacteria or fungus.  Organ failure. This is when your body's organs stop working. The virus that causes COVID-19 is contagious. This means that it can spread from person to person through  droplets from coughs and sneezes (respiratory secretions). What are the causes? This illness is caused by a virus. You may catch the virus by:  Breathing in droplets from an infected person. Droplets can be spread by a person breathing, speaking, singing, coughing, or sneezing.  Touching something, like a table or a doorknob, that was exposed to the virus (contaminated) and then touching your mouth, nose, or eyes. What increases the risk? Risk for infection You are more likely to be infected with this virus if you:  Are within 6 feet (2 meters) of a person with COVID-19.  Provide care for or live with a person who is infected with COVID-19.  Spend time in crowded indoor spaces or live in shared housing. Risk for serious illness You are more likely to become seriously ill from the virus if you:  Are 31 years of age or older. The higher your age, the more you are at risk for serious illness.  Live in a nursing home or long-term care facility.  Have cancer.  Have a long-term (chronic) disease such as: ? Chronic lung disease, including chronic obstructive pulmonary disease or asthma. ? A long-term disease that lowers your body's ability to fight infection (immunocompromised). ? Heart disease, including heart failure, a condition in which the arteries that lead to the heart become narrow or blocked (coronary artery disease), a disease which makes the heart muscle thick, weak, or stiff (cardiomyopathy). ? Diabetes. ? Chronic kidney disease. ? Sickle cell disease, a condition in which red blood cells have an abnormal "sickle" shape. ? Liver disease.  Are obese. What are the signs or symptoms? Symptoms of this condition can range from mild to severe. Symptoms may appear any time from 2 to 14 days after being exposed to the virus. They include:  A fever or chills.  A cough.  Difficulty breathing.  Headaches, body aches, or muscle aches.  Runny or stuffy (congested) nose.  A  sore throat.  New loss of taste or smell. Some people may also have stomach problems, such as nausea, vomiting, or diarrhea. Other people may not have any symptoms of COVID-19. How is this diagnosed? This condition may be diagnosed based on:  Your signs and symptoms, especially if: ? You live in an area with a COVID-19 outbreak. ? You recently  traveled to or from an area where the virus is common. ? You provide care for or live with a person who was diagnosed with COVID-19. ? You were exposed to a person who was diagnosed with COVID-19.  A physical exam.  Lab tests, which may include: ? Taking a sample of fluid from the back of your nose and throat (nasopharyngeal fluid), your nose, or your throat using a swab. ? A sample of mucus from your lungs (sputum). ? Blood tests.  Imaging tests, which may include, X-rays, CT scan, or ultrasound. How is this treated? At present, there is no medicine to treat COVID-19. Medicines that treat other diseases are being used on a trial basis to see if they are effective against COVID-19. Your health care provider will talk with you about ways to treat your symptoms. For most people, the infection is mild and can be managed at home with rest, fluids, and over-the-counter medicines. Treatment for a serious infection usually takes places in a hospital intensive care unit (ICU). It may include one or more of the following treatments. These treatments are given until your symptoms improve.  Receiving fluids and medicines through an IV.  Supplemental oxygen. Extra oxygen is given through a tube in the nose, a face mask, or a hood.  Positioning you to lie on your stomach (prone position). This makes it easier for oxygen to get into the lungs.  Continuous positive airway pressure (CPAP) or bi-level positive airway pressure (BPAP) machine. This treatment uses mild air pressure to keep the airways open. A tube that is connected to a motor delivers oxygen to  the body.  Ventilator. This treatment moves air into and out of the lungs by using a tube that is placed in your windpipe.  Tracheostomy. This is a procedure to create a hole in the neck so that a breathing tube can be inserted.  Extracorporeal membrane oxygenation (ECMO). This procedure gives the lungs a chance to recover by taking over the functions of the heart and lungs. It supplies oxygen to the body and removes carbon dioxide. Follow these instructions at home: Lifestyle  If you are sick, stay home except to get medical care. Your health care provider will tell you how long to stay home. Call your health care provider before you go for medical care.  Rest at home as told by your health care provider.  Do not use any products that contain nicotine or tobacco, such as cigarettes, e-cigarettes, and chewing tobacco. If you need help quitting, ask your health care provider.  Return to your normal activities as told by your health care provider. Ask your health care provider what activities are safe for you. General instructions  Take over-the-counter and prescription medicines only as told by your health care provider.  Drink enough fluid to keep your urine pale yellow.  Keep all follow-up visits as told by your health care provider. This is important. How is this prevented?  There is no vaccine to help prevent COVID-19 infection. However, there are steps you can take to protect yourself and others from this virus. To protect yourself:   Do not travel to areas where COVID-19 is a risk. The areas where COVID-19 is reported change often. To identify high-risk areas and travel restrictions, check the CDC travel website: StageSync.si  If you live in, or must travel to, an area where COVID-19 is a risk, take precautions to avoid infection. ? Stay away from people who are sick. ? Wash your hands  often with soap and water for 20 seconds. If soap and water are not available,  use an alcohol-based hand sanitizer. ? Avoid touching your mouth, face, eyes, or nose. ? Avoid going out in public, follow guidance from your state and local health authorities. ? If you must go out in public, wear a cloth face covering or face mask. Make sure your mask covers your nose and mouth. ? Avoid crowded indoor spaces. Stay at least 6 feet (2 meters) away from others. ? Disinfect objects and surfaces that are frequently touched every day. This may include:  Counters and tables.  Doorknobs and light switches.  Sinks and faucets.  Electronics, such as phones, remote controls, keyboards, computers, and tablets. To protect others: If you have symptoms of COVID-19, take steps to prevent the virus from spreading to others.  If you think you have a COVID-19 infection, contact your health care provider right away. Tell your health care team that you think you may have a COVID-19 infection.  Stay home. Leave your house only to seek medical care. Do not use public transport.  Do not travel while you are sick.  Wash your hands often with soap and water for 20 seconds. If soap and water are not available, use alcohol-based hand sanitizer.  Stay away from other members of your household. Let healthy household members care for children and pets, if possible. If you have to care for children or pets, wash your hands often and wear a mask. If possible, stay in your own room, separate from others. Use a different bathroom.  Make sure that all people in your household wash their hands well and often.  Cough or sneeze into a tissue or your sleeve or elbow. Do not cough or sneeze into your hand or into the air.  Wear a cloth face covering or face mask. Make sure your mask covers your nose and mouth. Where to find more information  Centers for Disease Control and Prevention: StickerEmporium.tn  World Health Organization: https://thompson-craig.com/ Contact  a health care provider if:  You live in or have traveled to an area where COVID-19 is a risk and you have symptoms of the infection.  You have had contact with someone who has COVID-19 and you have symptoms of the infection. Get help right away if:  You have trouble breathing.  You have pain or pressure in your chest.  You have confusion.  You have bluish lips and fingernails.  You have difficulty waking from sleep.  You have symptoms that get worse. These symptoms may represent a serious problem that is an emergency. Do not wait to see if the symptoms will go away. Get medical help right away. Call your local emergency services (911 in the [place] Do not drive yourself to the hospital. Let the emergency medical personnel know if you think you have COVID-19. Summary  COVID-19 is a respiratory infection that is caused by a virus. It is also known as coronavirus disease or novel coronavirus. It can cause serious infections, such as pneumonia, acute respiratory distress syndrome, acute respiratory failure, or sepsis.  The virus that causes COVID-19 is contagious. This means that it can spread from person to person through droplets from breathing, speaking, singing, coughing, or sneezing.  You are more likely to develop a serious illness if you are 44 years of age or older, have a weak immune system, live in a nursing home, or have chronic disease.  There is no medicine to treat COVID-19. Your health  care provider will talk with you about ways to treat your symptoms.  Take steps to protect yourself and others from infection. Wash your hands often and disinfect objects and surfaces that are frequently touched every day. Stay away from people who are sick and wear a mask if you are sick. This information is not intended to replace advice given to you by your health care provider. Make sure you discuss any questions you have with your health care provider. Document Revised: 01/23/2019 Document  Reviewed: 05/01/2018 Elsevier Patient Education  2020 [employer]

## 2020-01-01 NOTE — Progress Notes (Signed)
  Progress Note   29 y.o. G2P1001 @ [redacted]w[redacted]d , admitted for pregnancy, Covid positive, multifocal pneumonia  Subjective:  She has been refusing medication management and has been on room air. Plan was made for discharge by hospitalist. Patient has no obstetric complaints. She admits positive fetal movement and denies contractions, vaginal bleeding and leakage of fluid.  Objective:  BP 106/71 (BP Location: Left Arm)   Pulse 81   Temp 98.1 F (36.7 C) (Oral)   Resp 15   Ht 5\' 7"  (1.702 m)   Wt 108.9 kg   SpO2 95%   BMI 37.59 kg/m  Abd: gravid, ND, FHT present, mild tenderness on exam Extr: trace edema  Cervical exam: deferred  NST/EFM: FHR: 120 bpm, variability: moderate,  accelerations:  Present,  decelerations:  Absent Toco: irritability  Labs: I have reviewed the patient's lab results.   Assessment & Plan:  G2P1001 @ [redacted]w[redacted]d, admitted for third trimester pregnancy, Covid Positive under care of Hospitalist Dr [redacted]w[redacted]d  1. Pain management: none. 2. FWB: FHT category I 3. ID: GBS negative 4. Plan of Care: discharge to home per hospitalist    All discussed with patient, see orders   Allena Katz, CNM [facility] 01/01/2020  12:33 PM

## 2020-01-01 NOTE — Discharge Summary (Signed)
Physician Discharge Summary  Jason Filamilie Nicolini HYQ:[id number] DOB: 04/08/1991 DOA: 12/30/2019  PCP: Margaretann LovelessBurnette, Jennifer M, PA-C  Admit date: 12/30/2019 Discharge date: 01/01/2020   Discharge Diagnoses:  Principal Problem:   Chest pain Active Problems:   Pneumonia due to COVID-19 virus   Hypoxia   Vomiting   Third trimester pregnancy  Chest pain -CTA chest ruled out PE. Showed multifocal pneumonia --Cont Remdesivir vit,zinc,albuterol and vitamins.  9/23 -Patient's chest pain is resolved attributed to pain patient's Covid pneumonia.  -Patient's cough is improved.  Patient's oxygenation is stable at 95 to 97% on room air. 9/24 -resolved.  Pneumonia due to COVID-19 viruswith hypoxia -Remdesivir, albuterol, antitussives, vitamins. -pt is not hypoxic is started on 1L [place] oxygen for comfort.  -Discussed with patient in detail about treatment modalities for Covid 19 infection. Discussed with patient that remdesivir is currently indicated for her COVID-19 infection pneumonia.  Also discussed with patient side effects of remdesivir and that it is her choice to decide to take the medication.  Patient states that she would like to talk to her family and decide on further plan for treatment.  Discussed with patient that dexamethasone is currently not indicated her oxygen saturations is within normal limits. Vomiting -Antiemetics Prn only. -stable pt has declined treatment with remdesivir and steroids.  -q4 hourly pulse oximetry if level are 94% or below seek medical care.   Third trimester pregnancy -OB/GYN consult -Case discussed with Dr. Tiburcio PeaHarris who prefers that patient be on the medical floor(as opposed to mother-baby/labor delivery)but should the need arise will transfer immediately to labor delivery.    Discharge Condition:  Stable   Diet recommendation:  Heart healthy diet.  Filed Weights   12/30/19 1118  Weight: 108.9 kg    Discharge activity: As tolerated.  History  of present illness:  Chelsey Fernandez a 29 y.o.femaleat [redacted] weeks gestation, COVID-19 positive who received a monoclonal antibodies on 12/27/2019 who presents to the emergency room after she was awakened with retrosternal chest pain.Patient states that since receiving monoclonal antibodies 2 days prior she has had nausea and vomiting. She is also had mild shortness of breath. No cough or fever. Denies abdominal pain or diarrhea.  ED Course:On arrival in the emergency room she was tachycardic and tachypneic with O2 sat in the mid 90s on room air however desatted to 88% with lying flat and with activity. Initial blood work was unremarkable she had a CTA PE protocol that showed multifocal pulmonary infiltrates in keeping with atypical infection which can be seen with COVID-19 pneumonia. The emergency room provider spoke with OB/GYN who requested admission to the medical service on the medical floor. Did not think patient needed to be on the mother-baby or OB/GYN floor .hospitalist consulted for admission  EKG: as reviewed by me :Normal sinus rhythm no acute ST-T wave changes  Hospital Course:  Pt admitted on L/D and managed pt on prn oxygen and blood work for inflammatory markers. Pt has been stable and denies any chest pain sob or any other symptoms. She has oxygenated at 95% and above.Pt has declined remdesivir  and refuse all other treatment including dvt prophylaxis and has not needed dexamethasone. Pt verbalized understanding about q4 hourly pulse check.  Consultations: OB/GYN.  Discharge Exam: Vitals:   01/01/20 1016 01/01/20 1138  BP:    Pulse: 81 81  Resp: 15 15  Temp:    SpO2: 95% 95%    Physical Exam Vitals reviewed.  Constitutional:      Appearance: She is  normal weight.  HENT:     Head: Normocephalic and atraumatic.  Eyes:     Extraocular Movements: Extraocular movements intact.  Cardiovascular:     Rate and Rhythm: Normal rate and regular rhythm.  Pulmonary:      Effort: Pulmonary effort is normal.     Breath sounds: Normal breath sounds.  Abdominal:     General: Bowel sounds are normal.     Palpations: Abdomen is soft.  Musculoskeletal:        General: Normal range of motion.  Skin:    General: Skin is warm.  Neurological:     General: No focal deficit present.     Mental Status: She is alert and oriented to person, place, and time.  Psychiatric:        Behavior: Behavior normal.    Discharge Instructions  Discharge Instructions    Call MD for:  difficulty breathing, headache or visual disturbances   Complete by: As directed    Call MD for:  extreme fatigue   Complete by: As directed    Call MD for:  hives   Complete by: As directed    Call MD for:  persistant dizziness or light-headedness   Complete by: As directed    Call MD for:  persistant nausea and vomiting   Complete by: As directed    Call MD for:  severe uncontrolled pain   Complete by: As directed    Call MD for:  temperature >100.4   Complete by: As directed    Diet - low sodium heart healthy   Complete by: As directed    Discharge instructions   Complete by: As directed    PLEASE CHECK PULSE OXIMETRY Q4 HOURS DAILY UNTIL DELIVERY.   Increase activity slowly   Complete by: As directed    Lifting restrictions   Complete by: As directed    NO LIFTING ABOVE 5 POUNDS OR AS TOLERATED ONLY   No wound care   Complete by: As directed      Allergies as of 01/01/2020      Reactions   Regen-cov [casirivimab & Imdevimab] Nausea And Vomiting      Medication List    TAKE these medications   albuterol 108 (90 Base) MCG/ACT inhaler Commonly known as: VENTOLIN HFA Inhale 2 puffs into the lungs every 6 (six) hours.   aspirin EC 81 MG tablet Take 81 mg by mouth daily.   PRENATAL VITAMIN PO Take 1 tablet by mouth daily.   zinc sulfate 220 (50 Zn) MG capsule Take 1 capsule (220 mg total) by mouth daily. Start taking on: January 02, 2020      Allergies   Allergen Reactions  . Regen-Cov [Casirivimab & Imdevimab] Nausea And Vomiting    Follow-up Information    Margaretann Loveless, PA-C Follow up in 1 week(s).   Specialty: Family Medicine Contact information: [address] [place] [address] [phone]              The results of significant diagnostics from this hospitalization (including imaging, microbiology, ancillary and laboratory) are listed below for reference.    Significant Diagnostic Studies: CT Angio Chest PE W and/or Wo Contrast  Result Date: 12/30/2019 CLINICAL DATA:  Pregnant, COVID pneumonia, dyspnea EXAM: CT ANGIOGRAPHY CHEST WITH CONTRAST TECHNIQUE: Multidetector CT imaging of the chest was performed using the standard protocol during bolus administration of intravenous contrast. Multiplanar CT image reconstructions and MIPs were obtained to evaluate the vascular anatomy. CONTRAST:  110mL OMNIPAQUE IOHEXOL 350 MG/ML SOLN COMPARISON:  None. FINDINGS: Cardiovascular: There is mild respiratory motion artifact, however, there is adequate opacification of the pulmonary arterial tree through the segmental level. There is no intraluminal filling defect identified to suggest acute pulmonary embolism. The central pulmonary arteries are of normal caliber. No significant coronary artery calcification. Cardiac size within normal limits. No pericardial effusion. Thoracic aorta is normal. Mediastinum/Nodes: No pathologic thoracic adenopathy. Small hiatal hernia. Lungs/Pleura: There are multifocal airspace infiltrates seen scattered asymmetrically throughout the lungs in keeping with acute infection or inflammation. The findings can be seen with, but are not prototypical of COVID 19 pneumonia. No pneumothorax or pleural effusion. Central airways are widely patent. Upper Abdomen: No acute abnormality. Musculoskeletal: No chest wall abnormality. No acute or significant osseous findings. Review of the MIP images confirms the  above findings. IMPRESSION: Multifocal pulmonary infiltrates in keeping with atypical infection or inflammation in the acute setting. The findings can be seen with, but are not prototypical of COVID 19 pneumonia. Electronically Signed   By: Helyn Numbers MD   On: 12/30/2019 04:07   US OB Comp + 14 Wk  Result Date: 12/27/2019 CLINICAL DATA:  29 year old pregnant female, COVID positive, with no prenatal care. EXAM: OBSTETRICAL ULTRASOUND >14 WKS FINDINGS: Number of Fetuses: 1 Heart Rate:  143 bpm Movement: Yes Presentation: Cephalic Previa: No Placental Location: Posterior Amniotic Fluid (Subjective): Decreased Amniotic Fluid (Objective): Vertical pocket = 2.4cm AFI = 7.2 cm (5%ile= 7.5 cm, 95%= 24.4 cm for 37 wks) FETAL BIOMETRY BPD: 9.7cm 39w 3d HC:   35.5cm 41w 4d AC:   33.3cm 37w 2d FL:   7.2cm 37w 0d Current Mean GA: 38w 6d [place] EDC: 01/04/2020 Assigned GA:  37w 3d Assigned EDC: 01/14/2020 Estimated Fetal Weight:  3,342g 71%ile FETAL ANATOMY Lateral Ventricles: Not visualized Thalami/CSP: Not visualized Posterior Fossa:  Not visualized Nuchal Region: Not visualized Upper Lip: Not visualized Spine: Appears normal 4 Chamber Heart on Left: Not visualized LVOT: Not visualized RVOT: Appears normal Stomach on Left: Appears normal 3 Vessel Cord: Appears normal Cord Insertion site: Not visualized Kidneys: Appears normal Bladder: Appears normal Extremities: Not visualized Sex: Female external genitalia Technically difficult due to: Advanced gestational age, maternal habitus and fetal position. Maternal Findings: Cervix:  Not visualized on these transabdominal views. IMPRESSION: 1. Single living intrauterine gestation in cephalic lie at 38 weeks 6 days by average ultrasound age, compared to the expected gestational age of [redacted] weeks 3 days by assigned dating. 2. Estimated fetal weight 3342 g, at the 71st percentile for expected gestational age. 3. Borderline oligohydramnios.  AFI 7.2. 4. Otherwise no fetal or maternal  abnormalities detected, with significant limitations as detailed due to advanced gestational age. Electronically Signed   By: Delbert Phenix M.D.   On: 12/27/2019 17:10   US Abdomen Limited RUQ  Result Date: 12/27/2019 CLINICAL DATA:  Right upper quadrant pain for several months. The patient is [redacted] weeks pregnant. COVID-19 positive patient. EXAM: ULTRASOUND ABDOMEN LIMITED RIGHT UPPER QUADRANT COMPARISON:  None. FINDINGS: Gallbladder: Multiple mobile stones are seen in the gallbladder. No wall thickening, pericholecystic fluid, or Murphy's sign. Common bile duct: Diameter: 4 mm Liver: No focal lesion identified. Within normal limits in parenchymal echogenicity. Portal vein is patent on color Doppler imaging with normal direction of blood flow towards the liver. Other: Significant right-sided hydronephrosis is identified. Evaluation of the right kidney was limited on this right upper quadrant ultrasound however. IMPRESSION: 1. Cholelithiasis without wall thickening, pericholecystic fluid, or Murphy's  sign. 2. Significant hydronephrosis seen on limited views of the right kidney. Given the patient's pregnant status, the hydronephrosis could be due to the gravid uterus compressing the ureter. However, the definitive cause for hydronephrosis cannot be determined on this study. Recommend clinical correlation. Electronically Signed   By: Gerome Sam III M.D   On: 12/27/2019 17:06   Microbiology: Recent Results (from the past 240 hour(s))  SARS Coronavirus 2 by RT PCR (hospital order, performed in [facility] hospital lab) Nasopharyngeal Nasopharyngeal Swab     Status: Abnormal   Collection Time: 12/27/19  2:52 PM   Specimen: Nasopharyngeal Swab  Result Value Ref Range Status   SARS Coronavirus 2 POSITIVE (A) NEGATIVE Final    Comment: RESULT CALLED TO, READ BACK BY AND VERIFIED WITH: SARAH APEL ON 12/27/19 AT 1735 QSD (NOTE) SARS-CoV-2 target nucleic acids are DETECTED  SARS-CoV-2 RNA is generally  detectable in upper respiratory specimens  during the acute phase of infection.  Positive results are indicative  of the presence of the identified virus, but do not rule out bacterial infection or co-infection with other pathogens not detected by the test.  Clinical correlation with patient history and  other diagnostic information is necessary to determine patient infection status.  The expected result is negative.  Fact Sheet for Patients:   BoilerBrush.com.cy   Fact Sheet for Healthcare Providers:   https://pope.com/    This test is not yet approved or cleared by the [place] FDA and  has been authorized for detection and/or diagnosis of SARS-CoV-2 by FDA under an Emergency Use Authorization (EUA).  This EUA will remain in effect (meaning this tes t can be used) for the duration of  the COVID-19 declaration under Section 564(b)(1) of the Act, 21 U.S.C. section 360-bbb-3(b)(1), unless the authorization is terminated or revoked sooner.  Performed at [facility], [address] [place], [place] [address]   Chlamydia/NGC rt PCR [facility] only)     Status: None   Collection Time: 12/27/19  7:40 PM   Specimen: Cervical/Vaginal swab; Genital  Result Value Ref Range Status   Specimen source GC/Chlam ENDOCERVICAL  Final   Chlamydia Tr NOT DETECTED NOT DETECTED Final   N gonorrhoeae NOT DETECTED NOT DETECTED Final    Comment: (NOTE) This CT/NG assay has not been evaluated in patients with a history of  hysterectomy. Performed at [facility], [address] [place], [place] [address]   Group B strep by PCR     Status: None   Collection Time: 12/27/19  7:40 PM   Specimen: Vaginal/Rectal; Genital  Result Value Ref Range Status   Group B strep by PCR NEGATIVE NEGATIVE Final    Comment: (NOTE) Intrapartum testing with Xpert GBS assay should be used as an adjunct to other methods available and not used to replace  antepartum testing (at 35-[redacted] weeks gestation). Performed at [facility], [address] [place], [place] [address]      Labs: Basic Metabolic Panel: Recent Labs  Lab 12/27/19 1418 12/30/19 0245 12/30/19 1011 12/31/19 0657 01/01/20 0540  NA 134* 136 134* 135 138  K 3.7 4.0 4.3 3.8 3.4*  CL 102 108 105 108 109  CO2 19* 15* 15* 16* 20*  GLUCOSE 81 82 78 92 80  BUN <5* 7 7 7 6   CREATININE 0.49 0.67 0.67 0.70 0.58  CALCIUM 8.6* 9.4 9.5 8.5* 8.3*   Liver Function Tests: Recent Labs  Lab 12/30/19 1011 12/31/19 0657 01/01/20 0540  AST  28 61* 174*  ALT 21 58* 203*  ALKPHOS 158* 157* 139*  BILITOT 1.8* 1.5* 1.0  PROT 7.2 6.6 5.7*  ALBUMIN 2.9* 2.6* 2.3*   CBC: Recent Labs  Lab 12/27/19 1418 12/30/19 0245 12/30/19 1011 12/31/19 0657 01/01/20 0540  WBC 3.7* 7.2 6.4 6.7 6.3  NEUTROABS  --   --  4.6 4.5 4.4  HGB 11.9* 13.0 12.4 11.4* 10.3*  HCT 36.2 39.6 37.5 33.5* 30.6*  MCV 84.4 85.0 83.7 82.9 83.6  PLT 156 203 182 173 134*   Time spent: 15 minutes  Signed:  Gertha Calkin MD.  [facility] 01/01/2020, 1:03 PM

## 2020-01-01 NOTE — Progress Notes (Signed)
Discharge orders received from Dr Allena Katz. MD requested that patient go home with a portable Pulse Ox to measure her O2 Saturations. Order was placed for [facility] team to supply patient with Pulse ox before discharge.

## 2020-01-04 ENCOUNTER — Telehealth: Payer: Self-pay

## 2020-01-04 NOTE — Telephone Encounter (Signed)
Transition Care Management Follow-up Telephone Call  Date of discharge and from where: [facility] on 01/01/20.  How have you been since you were released from the hospital? Doing a lot better and improving daily. Oxygen is at 98%. Pt is still coughing but only in the morning after waking up. Pt states her appetite is normal and she is sleeping well. Declines SOB, sputum, pain, fever or n/v/d.  Any questions or concerns? No   Items Reviewed:  Did the pt receive and understand the discharge instructions provided? Yes   Medications obtained and verified? Yes   Any new allergies since your discharge? No   Dietary orders reviewed? N/A  Do you have support at home? Yes   Other (ie: DME, Home Health, etc): Pt was d/c with a pulse oximeter and a spirometer.  Functional Questionnaire: (I = Independent and D = Dependent)  Bathing/Dressing- I   Meal Prep- I  Eating- I  Maintaining continence- I  Transferring/Ambulation- I  Managing Meds- I   Follow up appointments reviewed:    PCP Hospital f/u appt confirmed? No , pt declines scheduling a HFU apt due to moving.  Specialist Hospital f/u appt confirmed? N/A   Are transportation arrangements needed? No   If their condition worsens, is the pt aware to call  their PCP or go to the ED? Yes  Was the patient provided with contact information for the PCP's office or ED? Yes  Was the pt encouraged to call back with questions or concerns? Yes

## 2022-02-14 IMAGING — CT CT ANGIO CHEST
2 of 6 series · 18 of 46 positions shown · IV contrast (APPLIED)
Comparison: None.

CLINICAL DATA: Pregnant, COVID pneumonia, dyspnea

EXAM:
CT ANGIOGRAPHY CHEST WITH CONTRAST
TECHNIQUE: Multidetector CT imaging of the chest was performed using the
standard protocol during bolus administration of intravenous
contrast. Multiplanar CT image reconstructions and MIPs were
obtained to evaluate the vascular anatomy.
CONTRAST:  75mL OMNIPAQUE IOHEXOL 350 MG/ML SOLN

[Series 5: thins · axial · 0.76mm/px · z∈[-531,-330]mm · 15 of 221 slices shown]
[im 10/221  lung]
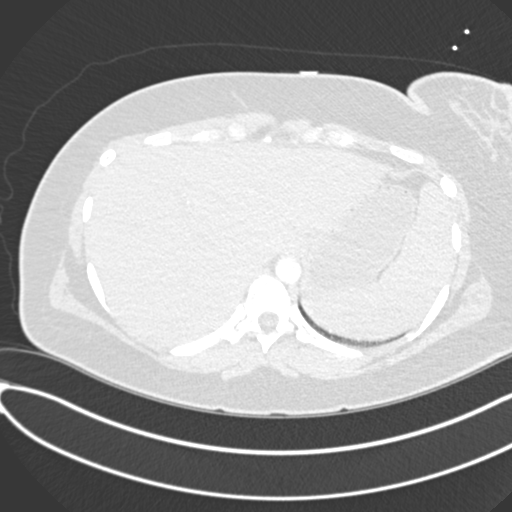
[im 29/221  soft-tissue]
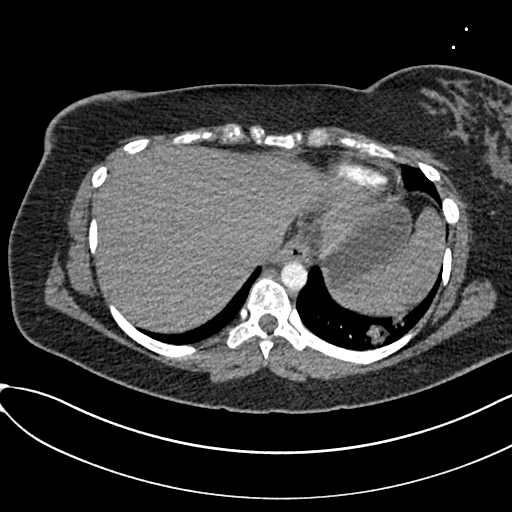
[im 39/221  lung]
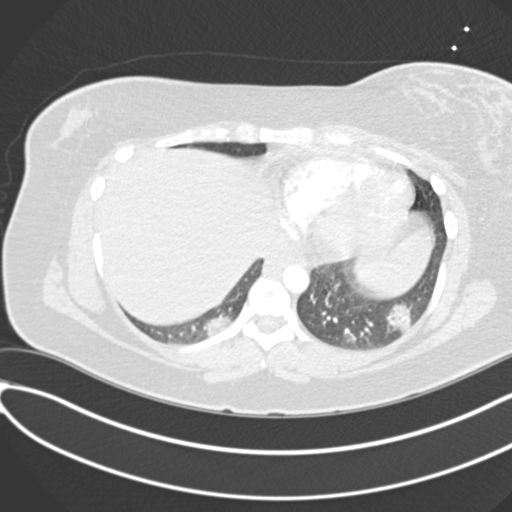
[im 58/221  soft-tissue]
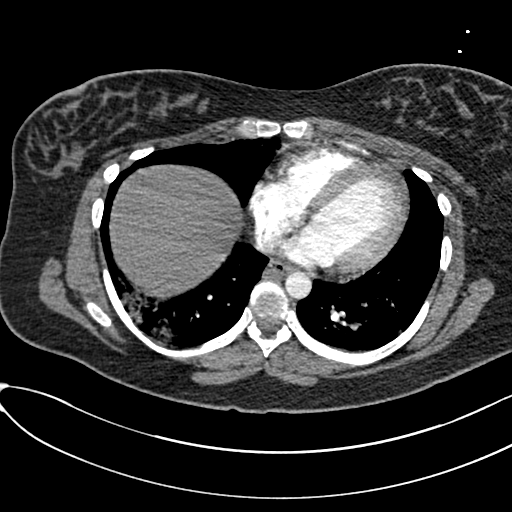
[im 67/221  lung]
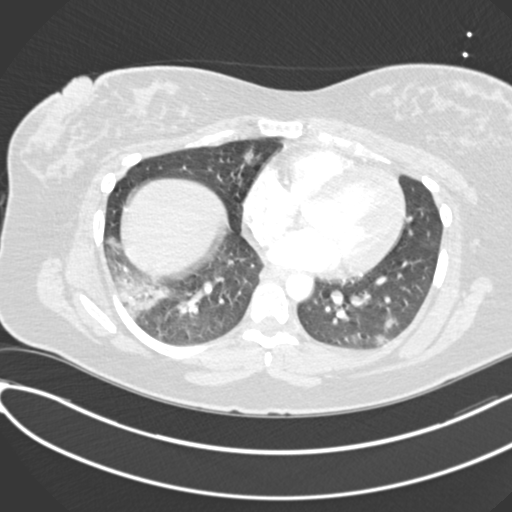
[im 87/221  soft-tissue]
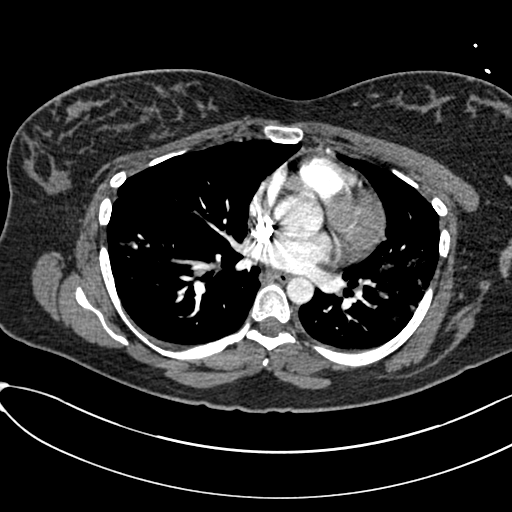
[im 96/221  lung]
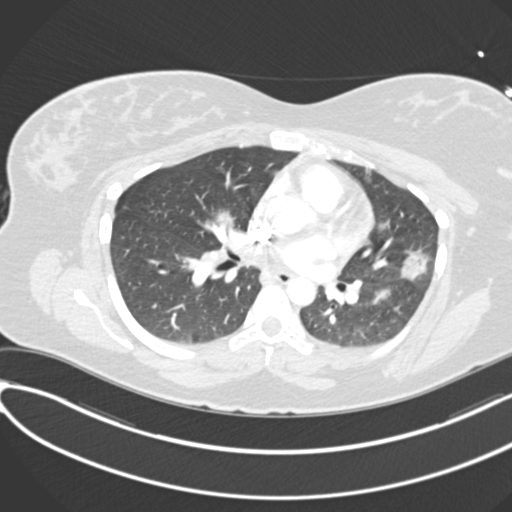
[im 115/221  soft-tissue]
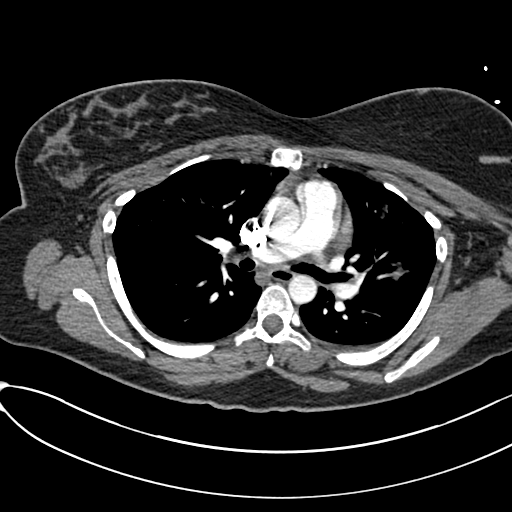
[im 125/221  lung]
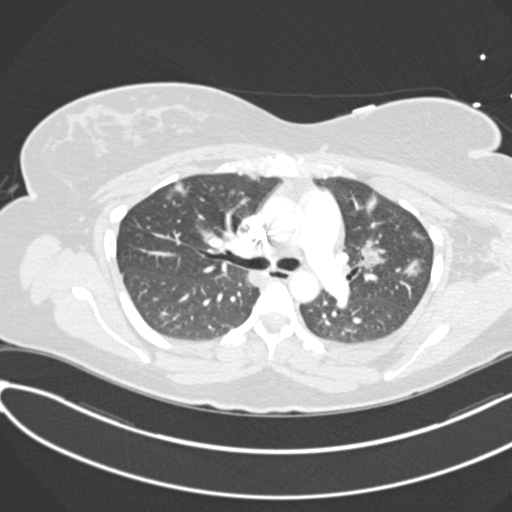
[im 134/221  soft-tissue]
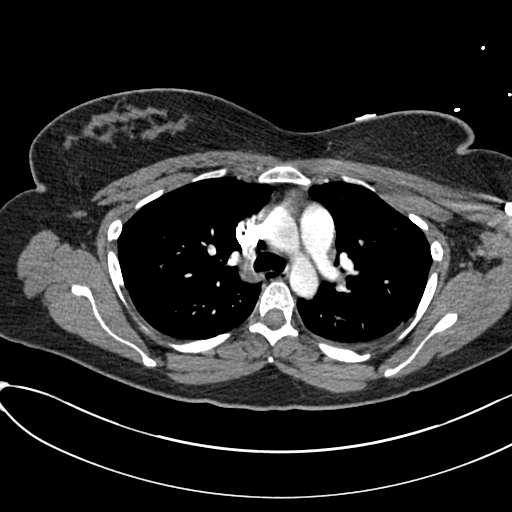
[im 154/221  lung]
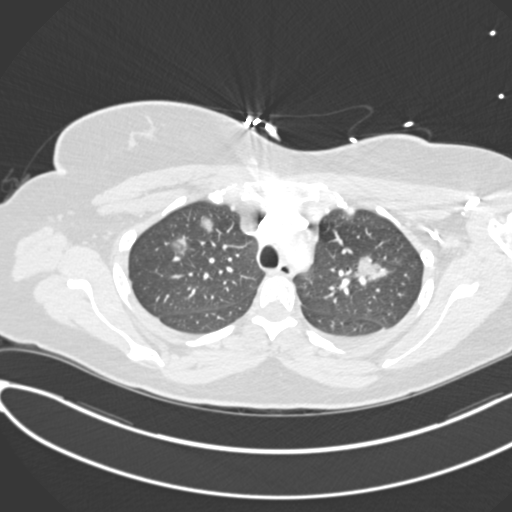
[im 163/221  soft-tissue]
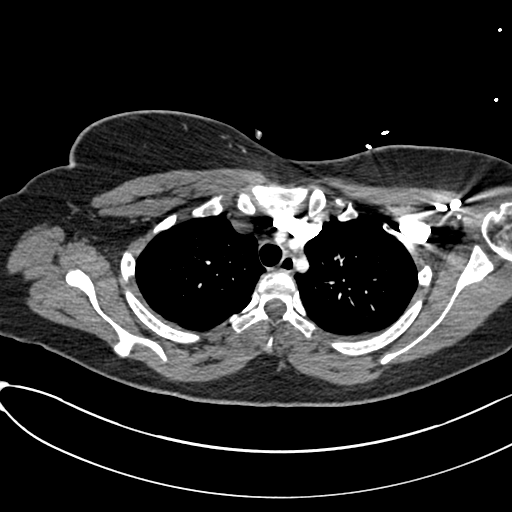
[im 182/221  lung]
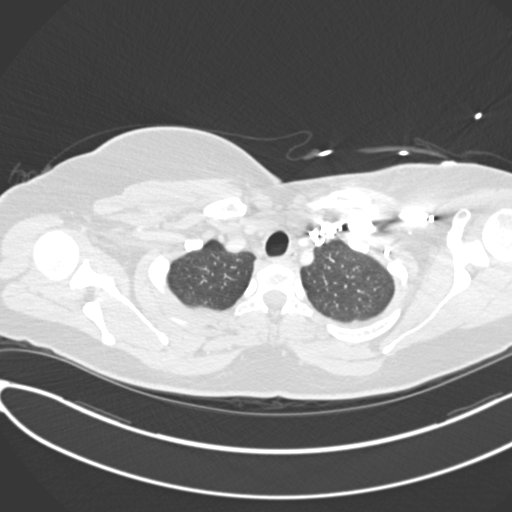
[im 192/221  soft-tissue]
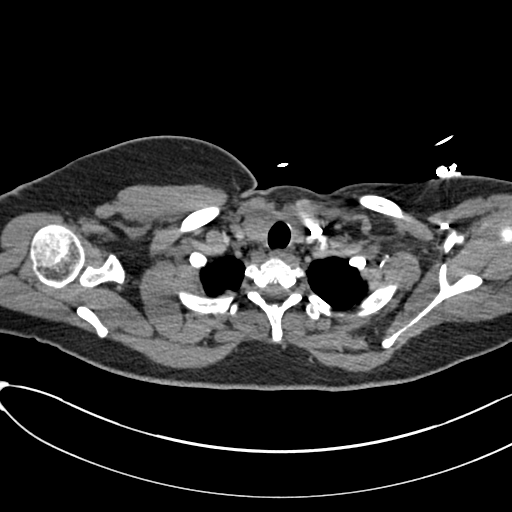
[im 211/221  lung]
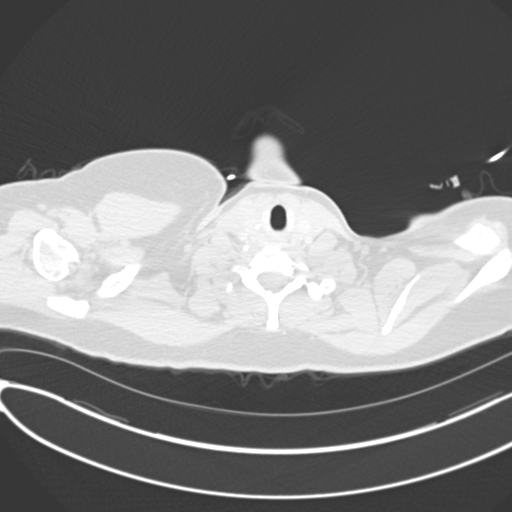

[Series 7: coronal mpr · coronal · 0.49mm/px · 3 of 106 slices shown]
[im 27/106  soft-tissue]
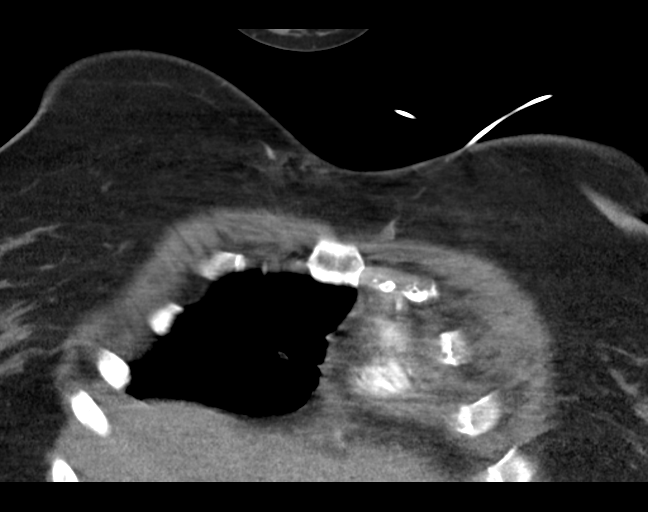
[im 53/106  soft-tissue]
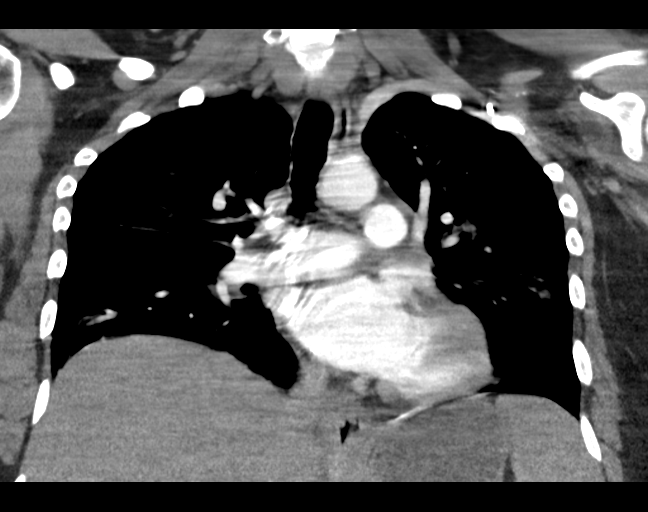
[im 79/106  soft-tissue]
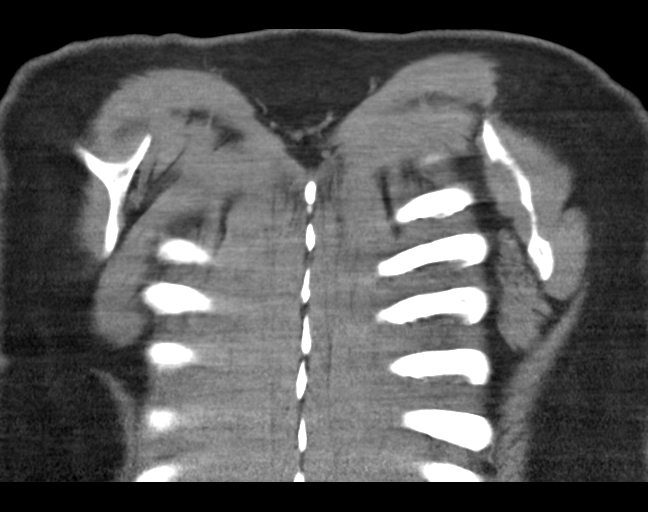

[18 of 46 positions shown; findings below may reference images not displayed]

FINDINGS: Cardiovascular: There is mild respiratory motion artifact, however,
there is adequate opacification of the pulmonary arterial tree
through the segmental level. There is no intraluminal filling defect
identified to suggest acute pulmonary embolism. The central
pulmonary arteries are of normal caliber. No significant coronary
artery calcification. Cardiac size within normal limits. No
pericardial effusion. Thoracic aorta is normal.

Mediastinum/Nodes: No pathologic thoracic adenopathy. Small hiatal
hernia.

Lungs/Pleura: There are multifocal airspace infiltrates seen
scattered asymmetrically throughout the lungs in keeping with acute
infection or inflammation. The findings can be seen with, but are
not prototypical of COVID 19 pneumonia. No pneumothorax or pleural
effusion. Central airways are widely patent.

Upper Abdomen: No acute abnormality.

Musculoskeletal: No chest wall abnormality. No acute or significant
osseous findings.

Review of the MIP images confirms the above findings.
IMPRESSION: Multifocal pulmonary infiltrates in keeping with atypical infection
or inflammation in the acute setting. The findings can be seen with,
but are not prototypical of COVID 19 pneumonia.

## 2023-04-23 ENCOUNTER — Ambulatory Visit: Payer: Self-pay

## 2024-04-15 ENCOUNTER — Ambulatory Visit: Payer: Self-pay

## 2024-04-15 ENCOUNTER — Other Ambulatory Visit: Payer: Self-pay

## 2024-04-15 VITALS — BP 102/68 | HR 102 | Ht 67.0 in | Wt 239.2 lb

## 2024-04-15 DIAGNOSIS — K219 Gastro-esophageal reflux disease without esophagitis: Secondary | ICD-10-CM | POA: Diagnosis not present

## 2024-04-15 DIAGNOSIS — Z3201 Encounter for pregnancy test, result positive: Secondary | ICD-10-CM | POA: Diagnosis not present

## 2024-04-15 DIAGNOSIS — O219 Vomiting of pregnancy, unspecified: Secondary | ICD-10-CM

## 2024-04-15 DIAGNOSIS — Z3A09 9 weeks gestation of pregnancy: Secondary | ICD-10-CM | POA: Diagnosis not present

## 2024-04-15 LAB — POCT PREGNANCY, URINE: Preg Test, Ur: POSITIVE — AB

## 2024-04-15 MED ORDER — ONDANSETRON HCL 4 MG PO TABS: 4.0000 mg | ORAL_TABLET | Freq: Three times a day (TID) | ORAL | 0 refills | Status: DC | PRN

## 2024-04-15 MED ORDER — OMEPRAZOLE 40 MG PO CPDR: 40.0000 mg | DELAYED_RELEASE_CAPSULE | Freq: Every day | ORAL | 1 refills | Status: AC

## 2024-04-15 MED ORDER — DOXYLAMINE-PYRIDOXINE 10-10 MG PO TBEC: DELAYED_RELEASE_TABLET | ORAL | 1 refills | Status: AC

## 2024-04-15 NOTE — Progress Notes (Signed)
 Patient came today for a pregnancy test which was positive. She reports sure, regular  LMP11/2/25 which makes her [redacted]w[redacted]d with EDD 11/15/24.  This is her  3rd  normal full term pregnancy.    She has no  medical issues except  prediabetes. She has had borderline high blood pressures.  List given for prenatal providers. She plans to see Cornell Finder , CNM and her home birth providers.  Medications and allergies reviewed.  She c/o frequent nausea and vomiting and reflux.  She request Zofran  rx as she has used this in the past and Diclegis . She reports she has lost about 20 pounds this pregnancy.  Cornell Finder , CNM  In to greet patient and approved prescriptions for Zofran  and Diclegis  and Omeprazole .  Sent to desk to schedule  new ob visit.  Rock Skip PEAK

## 2024-04-15 NOTE — Patient Instructions (Signed)

## 2024-04-16 ENCOUNTER — Encounter: Payer: Self-pay | Admitting: Certified Nurse Midwife

## 2024-04-17 ENCOUNTER — Encounter: Payer: Self-pay | Admitting: Certified Nurse Midwife

## 2024-04-24 ENCOUNTER — Telehealth: Payer: Self-pay | Admitting: Lactation Services

## 2024-04-24 NOTE — Telephone Encounter (Signed)
 PA for Omeprazole  denied as is OTC. Awaiting determination on Diclegis .   Chelsey Fernandez (KeyBETHA PEPER) PA Case ID #: [id number] Need Help? Call us  at [phone] Status sent iconSent to Plan today Drug Doxylamine -Pyridoxine  10-10MG  dr tablets ePA cloud logo Form [employer] [place] [employer] Electronic Request Form  Chelsey Fernandez (Key: [id number]) PA Case ID #: [id number] Need Help? Call us  at [phone] Outcome Denied today by [facility] Commercial [facility] 2017 Denied. This health benefit plan does not cover the following services, supplies, drugs or charges: Any drug that is therapeutically equivalent to an over-the-counter drug where the over-the-counter products contain the same active ingredients as the prescription product at the same, or similar, strengths. -OR- Drugs that are purchased over-the-counter, unless specifically listed as a covered drug in the formulary and a written prescription is provided. Drug Omeprazole  40MG  dr capsules ePA cloud logo Form [employer] [place] Commercial Electronic Request Form

## 2024-04-27 ENCOUNTER — Other Ambulatory Visit: Payer: Self-pay | Admitting: Certified Nurse Midwife

## 2024-04-27 ENCOUNTER — Encounter: Payer: Self-pay | Admitting: Lactation Services

## 2024-04-27 DIAGNOSIS — O219 Vomiting of pregnancy, unspecified: Secondary | ICD-10-CM

## 2024-04-27 NOTE — Telephone Encounter (Signed)
 Diclegis  Denied by [employer]. Will notify patient and provider.   [name] (KeyBETHA PEPER) PA Case ID #: [id number] Need Help? Call us  at [phone] Outcome Denied on January 17 by [facility] Commercial [facility] 2017 Denied. This health benefit plan does not cover the following services, supplies, drugs or charges: Any drug that is therapeutically equivalent to an over-the-counter drug where the over-the-counter products contain the same active ingredients as the prescription product at the same, or similar, strengths. -OR- Drugs that are purchased over-the-counter, unless specifically listed as a covered drug in the formulary and a written prescription is provided. Drug Doxylamine -Pyridoxine  10-10MG  dr tablets ePA cloud logo Form [employer] [place] Commercial Electronic Request Form

## 2024-04-27 NOTE — Telephone Encounter (Signed)
 Patient left voicemail requesting refill for Zofran .   [place], RN

## 2024-04-29 ENCOUNTER — Encounter: Payer: Self-pay | Admitting: Certified Nurse Midwife

## 2024-04-29 ENCOUNTER — Telehealth: Payer: Self-pay

## 2024-04-29 DIAGNOSIS — Z3480 Encounter for supervision of other normal pregnancy, unspecified trimester: Secondary | ICD-10-CM | POA: Insufficient documentation

## 2024-04-29 DIAGNOSIS — Z3481 Encounter for supervision of other normal pregnancy, first trimester: Secondary | ICD-10-CM

## 2024-04-29 DIAGNOSIS — O219 Vomiting of pregnancy, unspecified: Secondary | ICD-10-CM

## 2024-04-29 DIAGNOSIS — Z3A11 11 weeks gestation of pregnancy: Secondary | ICD-10-CM

## 2024-04-29 DIAGNOSIS — O3680X0 Pregnancy with inconclusive fetal viability, not applicable or unspecified: Secondary | ICD-10-CM

## 2024-04-29 NOTE — Progress Notes (Signed)
 New OB Intake  I connected with Chelsey Fernandez  on 04/29/24 at  1:15 PM EST by MyChart Video Visit and verified that I am speaking with the correct person using two identifiers. Nurse is located at [facility] and pt is located at home.  I discussed the limitations, risks, security and privacy concerns of performing an evaluation and management service by telephone and the availability of in person appointments. I also discussed with the patient that there may be a patient responsible charge related to this service. The patient expressed understanding and agreed to proceed.  I explained I am completing New OB Intake today. We discussed EDD of 11/15/24 based on LMP of 02/09/24. Pt is [id number]. I reviewed her allergies, medications and Medical/Surgical/OB history.    Patient Active Problem List   Diagnosis Date Noted   Supervision of other normal pregnancy, antepartum 04/29/2024   Pneumonia due to COVID-19 virus 12/30/2019   Hypoxia 12/30/2019   Chest pain 12/30/2019   Vomiting 12/30/2019   Third trimester pregnancy 12/30/2019   Labor and delivery indication for care or intervention 12/27/2019   Labor and delivery, indication for care 11/10/2015     Concerns addressed today  Delivery Plans Plans to deliver at [facility]. Discussed the nature of our practice with multiple providers including residents and students as well as female and female providers. Due to the size of the practice, the delivering provider may not be the same as those providing prenatal care.   Patient is not interested in water birth.  MyChart/Babyscripts MyChart access verified. I explained pt will have some visits in office and some virtually. Babyscripts instructions given and order placed. Patient verifies receipt of registration text/e-mail. Account successfully created and app downloaded. If patient is a candidate for Optimized scheduling, add to sticky note.   Blood Pressure Cuff/Weight Scale Pt has own BP Cuff, Explained  after first prenatal appt pt will check weekly and document in Babyscripts. Patient does have weight scale.  Anatomy US  Explained first scheduled US  will be around 19 weeks. Dating US  scheduled for 05/06/24 at 1:15p.  Is patient a CenteringPregnancy candidate?  Pt. Wanted to be on Chelsey Fernandez schedule       Is patient a Mom+Baby Combined Care candidate?  Not a candidate   If accepted, confirm patient does not intend to move from the area for at least 12 months, then notify Mom+Baby staff  Is patient a candidate for Babyscripts Optimization?    First visit review I reviewed new OB appt with patient. Explained pt will be seen by Chelsey Fernandez, CNM at first visit. Discussed Chelsey Fernandez genetic screening with patient. Needs Panorama and Horizon.. Routine prenatal labs needed at [facility] visit.   Last Pap 02/23/2020-Neg @ [facility]  Chelsey Fernandez, CMA 04/29/2024  1:54 PM

## 2024-04-30 ENCOUNTER — Telehealth: Payer: Self-pay | Admitting: Family Medicine
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
## 2024-05-06 ENCOUNTER — Other Ambulatory Visit

## 2024-05-06 ENCOUNTER — Encounter: Payer: Self-pay | Admitting: Certified Nurse Midwife

## 2024-05-12 MED ORDER — ONDANSETRON HCL 4 MG PO TABS: 4.0000 mg | ORAL_TABLET | Freq: Three times a day (TID) | ORAL | 3 refills | Status: AC | PRN

## 2024-05-27 ENCOUNTER — Encounter: Admitting: Certified Nurse Midwife
